# Patient Record
Sex: Male | Born: 1935 | Race: White | Hispanic: No | Smoking: Never smoker
Health system: Southern US, Community
[De-identification: ages and names within clinical notes are randomized; demographics above are authoritative.]

## PROBLEM LIST (undated history)

## (undated) DIAGNOSIS — C100 Malignant neoplasm of vallecula: Secondary | ICD-10-CM

## (undated) DIAGNOSIS — C61 Malignant neoplasm of prostate: Secondary | ICD-10-CM

## (undated) HISTORY — PX: PROSTATE SURGERY: SHX751

## (undated) HISTORY — DX: Malignant neoplasm of vallecula: C10.0

## (undated) HISTORY — DX: Malignant neoplasm of prostate: C61

---

## 1998-01-01 ENCOUNTER — Ambulatory Visit (HOSPITAL_BASED_OUTPATIENT_CLINIC_OR_DEPARTMENT_OTHER): Admission: RE | Admit: 1998-01-01 | Discharge: 1998-01-01 | Payer: Self-pay | Admitting: Otolaryngology

## 1998-01-08 ENCOUNTER — Encounter: Admission: RE | Admit: 1998-01-08 | Discharge: 1998-04-08 | Payer: Self-pay | Admitting: Radiation Oncology

## 2000-12-03 ENCOUNTER — Encounter: Payer: Self-pay | Admitting: Emergency Medicine

## 2000-12-03 ENCOUNTER — Emergency Department (HOSPITAL_COMMUNITY): Admission: EM | Admit: 2000-12-03 | Discharge: 2000-12-03 | Payer: Self-pay

## 2002-07-18 ENCOUNTER — Ambulatory Visit (HOSPITAL_COMMUNITY): Admission: RE | Admit: 2002-07-18 | Discharge: 2002-07-18 | Payer: Self-pay | Admitting: Gastroenterology

## 2002-07-18 ENCOUNTER — Encounter (INDEPENDENT_AMBULATORY_CARE_PROVIDER_SITE_OTHER): Payer: Self-pay | Admitting: *Deleted

## 2006-03-27 ENCOUNTER — Encounter (INDEPENDENT_AMBULATORY_CARE_PROVIDER_SITE_OTHER): Payer: Self-pay | Admitting: *Deleted

## 2006-03-27 ENCOUNTER — Ambulatory Visit (HOSPITAL_BASED_OUTPATIENT_CLINIC_OR_DEPARTMENT_OTHER): Admission: RE | Admit: 2006-03-27 | Discharge: 2006-03-27 | Payer: Self-pay | Admitting: Otolaryngology

## 2006-03-27 DIAGNOSIS — C100 Malignant neoplasm of vallecula: Secondary | ICD-10-CM

## 2006-03-27 HISTORY — DX: Malignant neoplasm of vallecula: C10.0

## 2006-11-27 ENCOUNTER — Ambulatory Visit: Admission: RE | Admit: 2006-11-27 | Discharge: 2006-12-12 | Payer: Self-pay | Admitting: Radiation Oncology

## 2007-01-29 ENCOUNTER — Ambulatory Visit (HOSPITAL_COMMUNITY): Admission: RE | Admit: 2007-01-29 | Discharge: 2007-01-29 | Payer: Self-pay | Admitting: Urology

## 2007-04-04 ENCOUNTER — Inpatient Hospital Stay (HOSPITAL_COMMUNITY): Admission: RE | Admit: 2007-04-04 | Discharge: 2007-04-05 | Payer: Self-pay | Admitting: Orthopedic Surgery

## 2007-04-04 ENCOUNTER — Encounter (INDEPENDENT_AMBULATORY_CARE_PROVIDER_SITE_OTHER): Payer: Self-pay | Admitting: Urology

## 2007-04-04 DIAGNOSIS — C61 Malignant neoplasm of prostate: Secondary | ICD-10-CM

## 2007-04-04 HISTORY — DX: Malignant neoplasm of prostate: C61

## 2008-05-03 ENCOUNTER — Emergency Department (HOSPITAL_COMMUNITY): Admission: EM | Admit: 2008-05-03 | Discharge: 2008-05-03 | Payer: Self-pay | Admitting: Emergency Medicine

## 2008-09-13 ENCOUNTER — Encounter: Admission: RE | Admit: 2008-09-13 | Discharge: 2008-09-13 | Payer: Self-pay | Admitting: Sports Medicine

## 2010-04-12 ENCOUNTER — Encounter: Payer: Self-pay | Admitting: Urology

## 2010-08-03 NOTE — Discharge Summary (Signed)
Dustin Wagner, Dustin Wagner         ACCOUNT NO.:  [id number]   MEDICAL RECORD NO.:  [id number]          PATIENT TYPE:  INP   LOCATION:  1432                         FACILITY:  [facility]   PHYSICIAN:  Heloise Purpura, MD      DATE OF BIRTH:  [birth year]   DATE OF ADMISSION:  04/04/2007  DATE OF DISCHARGE:  04/05/2007                               DISCHARGE SUMMARY   ADMISSION DIAGNOSIS:  Prostate cancer.   DISCHARGE DIAGNOSIS:  Prostate cancer.   HISTORY:  Mr. Yuhas is a 75 year old gentleman with clinically  localized adenocarcinoma of the prostate.  After discussion regarding  management options for treatment, he elected to proceed with surgical  therapy and a robotic prostatectomy.   HOSPITAL COURSE:  On April 04, 2007, the patient was taken to the  operating room and underwent a robotic-assisted laparoscopic radical  prostatectomy and bilateral pelvic lymphadenectomy.  He tolerated this  procedure well and without complications.  Postoperatively, he was able  to be transferred to a regular hospital room following recovery from  anesthesia.  He was able to begin ambulating the night of surgery and  remained hemodynamically stable that evening.  His hematocrit was  checked on the morning of postoperative day #1 and was found to be  stable at 38.1.  He was begun on a clear liquid diet, which he tolerated  without difficulty and was able to be transitioned to oral pain  medication.  He maintained excellent urine output from his catheter with  minimal output from his pelvic drain, and his pelvic drain was removed.  By the afternoon of postoperative day #1, he had met all discharge  criteria and was able to be discharged home in excellent condition.   DISPOSITION:  Home.   DISCHARGE MEDICATIONS:  He was instructed to resume his regular home  medications excepting any aspirin, nonsteroidal anti-inflammatory drugs,  or herbal supplements.  He was given a prescription to take Vicodin  as  needed for pain and Cipro to begin 1 day prior to his return visit for  catheter removal.   DISCHARGE INSTRUCTIONS:  He was instructed to be ambulatory but  specifically told to refrain from any heavy lifting, strenuous activity,  or driving.  He was told to gradually advance his diet once passing  flatus.  In addition, he was instructed on Foley catheter care.   DISPOSITION:  He will follow up in 1 week for removal of his catheter  and to discuss surgical pathology in detail.      Heloise Purpura, MD  Electronically Signed     LB/MEDQ  D:  04/05/2007  T:  04/05/2007  Job:  [id number]

## 2010-08-03 NOTE — Op Note (Signed)
NAMEGRANITE, GODMAN         ACCOUNT NO.:  [id number]   MEDICAL RECORD NO.:  [id number]          PATIENT TYPE:  INP   LOCATION:  0004                         FACILITY:  [facility]   PHYSICIAN:  Heloise Purpura, MD      DATE OF BIRTH:  1935/05/28   DATE OF PROCEDURE:  04/04/2007  DATE OF DISCHARGE:                               OPERATIVE REPORT   PREOPERATIVE DIAGNOSIS:  Clinically localized adenocarcinoma of  prostate.   POSTOPERATIVE DIAGNOSIS:  Clinically localized adenocarcinoma of  prostate.   PROCEDURE:  1. Robotic assisted laparoscopic radical prostatectomy (bilateral      nerve sparing).  2. Bilateral laparoscopic pelvic lymphadenectomy.   SURGEON:  Dr. Heloise Purpura.   ASSISTANT:  Dr. Georgeanna Lea   ANESTHESIA:  General.   COMPLICATIONS:  None.   ESTIMATED BLOOD LOSS:  150 mL.   INTRAVENOUS FLUIDS:  1500 mL of lactated Ringer's.   SPECIMENS:  1. Prostate seminal vesicles.  2. Right pelvic lymph nodes.  3. Left pelvic lymph nodes.   DISPOSITION OF SPECIMENS:  To pathology.   DRAINS:  1. 20-French coude catheter.  2. #19 Blake pelvic drain.   INDICATION:  Mr. Schwegler is a 75 year old gentleman with clinically  localized adenocarcinoma of the prostate.  After discussing management  options for treatment, he elected to proceed with the surgical removal  and the above procedures.  Potential risks, complications, and  alternative options were discussed with the patient in detail and  informed consent was obtained.   DESCRIPTION OF PROCEDURE:  The patient was taken to the operating room  and a general anesthetic was administered.  He was given preoperative  antibiotics, placed in the dorsal lithotomy position, prepped and draped  in the usual sterile fashion.  Next, a preoperative time-out was  performed.  A Foley catheter was then inserted into the bladder and a  site was selected just to the left of the umbilicus for placement of the  camera port.   This was placed using a standard open Hasson technique  which allowed entry into the peritoneal cavity under direct vision  without difficulty.  Next a 12 mm port was placed and a pneumoperitoneum  was established.  The 0 degrees lens was used to inspect the abdomen and  there was no evidence of any intra-abdominal injuries or other  abnormalities.  The remaining ports were then placed.  Bilateral 8 mm  robotic ports were placed 10 cm lateral to the camera port and just  inferior to the camera port site.  An additional 8 mm robotic port was  placed in the far left lateral abdominal wall.  A 5 mm port was placed  between the camera port and the right robotic port.  An additional 12 mm  port was placed in the far right lateral abdominal wall for laparoscopic  assistance.  All ports were placed under direct vision and without  difficulty.  The surgical cart was then docked.  With the aid of the  cautery scissors, the bladder was reflected posteriorly allowing entry  into the space of Retzius and identification of the endopelvic fascia  and prostate.  The endopelvic fascia was incised from the apex back to  the base of the prostate bilaterally and the underlying levator muscle  fibers were swept laterally off the prostate.  This isolated the dorsal  venous complex which was then stapled and divided with a 45 mm Flex ETS  stapler.  The bladder neck was then identified with the aid of Foley  catheter manipulation and divided anteriorly.  This exposed the Foley  catheter which was brought into the operative field after catheter  balloon was deflated.  The catheter was then used to retract the  prostate anteriorly and the posterior bladder neck was then divided  allowing dissection between the prostate and bladder neck until the vasa  deferentia and seminal vesicles were identified.  The vasa deferentia  were isolated, divided and lifted anteriorly.  The seminal vesicles were  then dissected down  toward their tips with care to control the seminal  vesicle blood supply with Hem-o-lok clips.  The seminal vesicles were  then lifted anteriorly and the space between Denonvilliers fascia and  the anterior rectum was bluntly developed.  This isolated the vascular  pedicles of the prostate.  The lateral prostatic fascia was then incised  sharply bilaterally allowing the neurovascular bundles to be swept  laterally and posteriorly off the prostate.  The vascular pedicles of  the prostate were then ligated with Hem-o-lok clips above the level of  the neurovascular bundles and divided with sharp cold scissor  dissection.  The neurovascular bundles were then swept off the apex of  the prostate and urethra in the urethra was sharply divided allowing the  prostate specimen to be disarticulated.  The pelvis was then copiously  irrigated and hemostasis was ensured.  There was no evidence of a rectal  injury.  Attention then turned to the right pelvic sidewall.  The  fibrofatty tissue between the external iliac vein, confluence of the  iliac vessels, hypogastric artery, and Cooper's ligament was dissected  free from the pelvic sidewall with care to preserve the obturator nerve.  Hem-o-lok clips were used for lymphostasis and hemostasis.  This  specimen was then removed for permanent pathologic analysis and an  identical procedure was performed on the contralateral side.  Attention  then turned to the urethral anastomosis.  A 2-0 Vicryl slip-knot was  placed between the fascia, the posterior bladder neck and the posterior  urethra to reapproximate these structures.  Upon cinching this knot  down, the stitch did pull through the posterior bladder neck.  However,  the posterior urethra was secured to Denonvilliers fascia appropriately.  A second 2-0 Vicryl slip-knot was then placed between the bladder neck  and the posterior urethra to reapproximate these structures  successfully.  A double-armed 3-0  Monocryl suture was then used to  perform a 360 degrees running tension-free anastomosis between the  bladder neck and urethra.  A new 20-French coude catheter was inserted  into the bladder and irrigated.  There no blood clots and anastomosis  appeared to be watertight.  A #19 Blake drain was brought through the  left robotic port appropriately positioned in the pelvis.  It was  secured to skin with a nylon suture.  The surgical cart was then  undocked.  The right lateral 12 mm port site was then closed with a 0-0  Vicryl suture placed with the aid of the suture passer device.  The  remaining ports were removed under direct vision and the prostate  specimen was removed intact  within the Endopouch retrieval bag via the  periumbilical incision site.  This fascial opening was closed with a  running 0-0 Vicryl suture.  All ports were  injected with 0.25% Marcaine and reapproximated at the skin level with  staples.  Sterile dressings were applied.  The patient appeared to  tolerate the procedure well and without complications.  He was able to  be extubated and transferred to the recovery unit in satisfactory  condition.      Heloise Purpura, MD  Electronically Signed     LB/MEDQ  D:  04/04/2007  T:  04/04/2007  Job:  [id number]

## 2010-08-03 NOTE — H&P (Signed)
NAMESACHIT, GILMAN NO.:  [id number]   MEDICAL RECORD NO.:  [id number]          PATIENT TYPE:  INP   LOCATION:  0004                         [facility]ACILITY:  [facility]   PHYSICIAN:  Heloise Purpura, MD      DATE OF BIRTH:  Nov 10, 1935   DATE OF ADMISSION:  04/04/2007  DATE OF DISCHARGE:                              HISTORY & PHYSICAL   CHIEF COMPLAINT:  Prostate cancer.   HISTORY:  Mr. Huckins is a 75 year old gentleman with clinical stage  T1C prostate cancer with a PSA of 8.18 and Gleason score of 3 +4 equals  7.  He underwent a bone scan which demonstrated no evidence of  metastatic disease.  After discussing management options for treatment,  he elected to proceed with surgical therapy and a robotic prostatectomy.   PAST MEDICAL HISTORY:  History of laryngeal cancer with no evidence of  disease recurrence.   PAST SURGICAL HISTORY:  Tonsillectomy.   MEDICATIONS:  None.   ALLERGIES:  No known drug allergies.   FAMILY HISTORY:  No history of prostate cancer or GU malignancy.  His  father died of a myocardial infarction.   SOCIAL HISTORY:  He denies alcohol or tobacco use   REVIEW OF SYSTEMS:  A complete Review of Systems was performed.  Pertinent positives include blurred vision, cough and polydipsia.   PHYSICAL EXAMINATION:  CONSTITUTIONAL: well-nourished, well-developed  age-appropriate male in no acute distress.  PULMONARY:  Lungs are clear bilaterally.  CARDIOVASCULAR:  Regular rate and rhythm without obvious murmurs.  ABDOMEN:  Soft, nontender, nondistended without abdominal masses.  GU: No prostate nodularity or induration.   IMPRESSION:  Clinically localized adenocarcinoma of prostate.   PLAN:  He will undergo robotic assisted laparoscopic radical  prostatectomy and bilateral pelvic lymphadenectomy.  He will then be  admitted to the hospital for routine postoperative care.      Heloise Purpura, MD  Electronically Signed     LB/MEDQ  D:   04/04/2007  T:  04/04/2007  Job:  [id number]

## 2010-08-06 NOTE — Op Note (Signed)
   NAME:  Dustin Wagner, CULL NO.:  [id number]   MEDICAL RECORD NO.:  [id number]                   PATIENT TYPE:  AMB   LOCATION:  ENDO                                 FACILITY:  [facility]   PHYSICIAN:  Graylin Shiver, M.D.                DATE OF BIRTH:  1936/01/29   DATE OF PROCEDURE:  07/18/2002  DATE OF DISCHARGE:                                 OPERATIVE REPORT   PROCEDURE:  Colonoscopy with biopsy.   INDICATIONS FOR PROCEDURE:  Screening.   PREMEDICATION:  Demerol 60 mg IV, Versed 6 mg IV.   INFORMED CONSENT:  Informed consent was obtained.   DESCRIPTION OF PROCEDURE:  With the patient in the left lateral decubitus  position, a rectal exam was performed. No masses were felt. There were some  small prolapsed hemorrhoids. The Olympus colonoscope was inserted into the  rectum and advanced around the colon to the cecum. Cecal landmarks were  identified. The cecum and ascending colon were normal. The transverse colon  was normal. In the proximal descending colon near the region of the splenic  flexure, there was a slightly raised 5 mm reddish area of tissue which may  be a small polyp. This was biopsied for histological inspection. The  descending colon and sigmoid revealed moderate diverticulosis. The rectum  appeared normal. The scope was brought out and he tolerated the procedure  well without complications.   IMPRESSION:  1. Possible polyp in the region of the splenic flexure area. The pathology     will be checked to determine if this is adenomatous hyperplastic or an     inflammatory area.  2. Diverticulosis of the left colon.  3. Hemorrhoids.                                               Graylin Shiver, M.D.    SFG/MEDQ  D:  07/18/2002  T:  07/18/2002  Job:  [id number]   cc:   Chales Salmon. Abigail Miyamoto, M.D.  [address]  [place]  [place] [address]  Fax: [phone]

## 2010-08-06 NOTE — Op Note (Signed)
NAMECOADY, TRAIN           ACCOUNT NO.:  [id number]   MEDICAL RECORD NO.:  [id number]          PATIENT TYPE:  AMB   LOCATION:  DSC                          FACILITY:  [facility]   PHYSICIAN:  Jefry H. Pollyann Kennedy, MD     DATE OF BIRTH:  [birth year]   DATE OF PROCEDURE:  03/27/2006  DATE OF DISCHARGE:                               OPERATIVE REPORT   PREOPERATIVE DIAGNOSIS:  Right vallecular mass.   POSTOPERATIVE DIAGNOSIS:  Right vallecular mass.   PROCEDURE:  Direct laryngoscopy with biopsy of right vallecular mass.   SURGEON:  Jefry H. Pollyann Kennedy, MD.   ANESTHESIA:  General endotracheal anesthesia was used.   COMPLICATIONS:  No complications.   BLOOD LOSS:  None.   FINDINGS:  Mild cobblestone appearance to the mucosa of the right  vallecula.  Biopsy was taken and sent for pathologic evaluation.  No  other findings noted.   HISTORY:  A 75 year old with a several-week history of a painful lesion  in the right vallecula that has been slow to resolve.  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  The risks, benefits, alternatives and complications to the procedure  were explained to the patient.  She did understand and agreed to  surgery.   DESCRIPTION OF PROCEDURE:  The patient was taken to the operating room  and placed on the operating room table in a supine position.  Following  induction of general endotracheal anesthesia, the table was turned.  The  patient was draped in the standard fashion.  Maxillary and mandibular  teeth protectors were used.  An anterior commissure laryngoscope was  used to examine the larynx and hypopharynx.  The above-mentioned  findings were noted.  Biopsies were taken.  The patient was then  awakened, extubated and transferred to recovery in stable condition.      Jefry H. Pollyann Kennedy, MD  Electronically Signed     JHR/MEDQ  D:  03/27/2006  T:  03/27/2006  Job:  [id number]

## 2010-12-09 LAB — TYPE AND SCREEN: Antibody Screen: NEGATIVE

## 2010-12-09 LAB — CBC
Hemoglobin: 15.1
MCHC: 34.9
RDW: 13.1

## 2010-12-09 LAB — BASIC METABOLIC PANEL
CO2: 28
Calcium: 9.2
Glucose, Bld: 158 — ABNORMAL HIGH
Sodium: 140

## 2010-12-09 LAB — HEMOGLOBIN AND HEMATOCRIT, BLOOD
HCT: 39.8
HCT: 38.1 — ABNORMAL LOW
Hemoglobin: 13.3

## 2011-01-17 ENCOUNTER — Encounter (INDEPENDENT_AMBULATORY_CARE_PROVIDER_SITE_OTHER): Payer: BC Managed Care – PPO | Admitting: Ophthalmology

## 2011-01-17 DIAGNOSIS — H33309 Unspecified retinal break, unspecified eye: Secondary | ICD-10-CM

## 2011-01-17 DIAGNOSIS — H43819 Vitreous degeneration, unspecified eye: Secondary | ICD-10-CM

## 2011-01-17 DIAGNOSIS — H2510 Age-related nuclear cataract, unspecified eye: Secondary | ICD-10-CM

## 2011-01-17 DIAGNOSIS — H3530 Unspecified macular degeneration: Secondary | ICD-10-CM

## 2011-05-13 DIAGNOSIS — C61 Malignant neoplasm of prostate: Secondary | ICD-10-CM | POA: Diagnosis not present

## 2011-05-20 DIAGNOSIS — R32 Unspecified urinary incontinence: Secondary | ICD-10-CM | POA: Diagnosis not present

## 2011-05-20 DIAGNOSIS — N529 Male erectile dysfunction, unspecified: Secondary | ICD-10-CM | POA: Diagnosis not present

## 2011-05-20 DIAGNOSIS — C61 Malignant neoplasm of prostate: Secondary | ICD-10-CM | POA: Diagnosis not present

## 2011-07-15 DIAGNOSIS — M545 Low back pain: Secondary | ICD-10-CM | POA: Diagnosis not present

## 2011-08-02 DIAGNOSIS — M545 Low back pain: Secondary | ICD-10-CM | POA: Diagnosis not present

## 2011-08-17 DIAGNOSIS — M545 Low back pain: Secondary | ICD-10-CM | POA: Diagnosis not present

## 2011-11-16 DIAGNOSIS — C61 Malignant neoplasm of prostate: Secondary | ICD-10-CM | POA: Diagnosis not present

## 2011-11-23 DIAGNOSIS — C61 Malignant neoplasm of prostate: Secondary | ICD-10-CM | POA: Diagnosis not present

## 2011-11-23 DIAGNOSIS — R32 Unspecified urinary incontinence: Secondary | ICD-10-CM | POA: Diagnosis not present

## 2011-11-23 DIAGNOSIS — N529 Male erectile dysfunction, unspecified: Secondary | ICD-10-CM | POA: Diagnosis not present

## 2012-01-17 ENCOUNTER — Encounter (INDEPENDENT_AMBULATORY_CARE_PROVIDER_SITE_OTHER): Payer: BC Managed Care – PPO | Admitting: Ophthalmology

## 2012-01-20 ENCOUNTER — Encounter (INDEPENDENT_AMBULATORY_CARE_PROVIDER_SITE_OTHER): Payer: BC Managed Care – PPO | Admitting: Ophthalmology

## 2012-06-10 ENCOUNTER — Ambulatory Visit (INDEPENDENT_AMBULATORY_CARE_PROVIDER_SITE_OTHER): Payer: Medicare Other | Admitting: Family Medicine

## 2012-06-10 VITALS — BP 126/81 | HR 70 | Temp 99.1°F | Resp 16 | Ht 70.0 in | Wt 204.8 lb

## 2012-06-10 DIAGNOSIS — M7130 Other bursal cyst, unspecified site: Secondary | ICD-10-CM

## 2012-06-10 DIAGNOSIS — J029 Acute pharyngitis, unspecified: Secondary | ICD-10-CM

## 2012-06-10 DIAGNOSIS — R1310 Dysphagia, unspecified: Secondary | ICD-10-CM | POA: Diagnosis not present

## 2012-06-10 DIAGNOSIS — C61 Malignant neoplasm of prostate: Secondary | ICD-10-CM | POA: Diagnosis not present

## 2012-06-10 DIAGNOSIS — E785 Hyperlipidemia, unspecified: Secondary | ICD-10-CM

## 2012-06-10 DIAGNOSIS — R109 Unspecified abdominal pain: Secondary | ICD-10-CM

## 2012-06-10 LAB — POCT CBC
Granulocyte percent: 78.9 %G (ref 37–80)
HCT, POC: 55.4 % — AB (ref 43.5–53.7)
Hemoglobin: 18 g/dL (ref 14.1–18.1)
Lymph, poc: 2.1 (ref 0.6–3.4)
MCH, POC: 30.7 pg (ref 27–31.2)
MCHC: 32.5 g/dL (ref 31.8–35.4)
MCV: 94.3 fL (ref 80–97)
MID (cbc): 0.9 (ref 0–0.9)
MPV: 9.9 fL (ref 0–99.8)
POC Granulocyte: 11.5 — AB (ref 2–6.9)
POC LYMPH PERCENT: 14.6 %L (ref 10–50)
POC MID %: 6.5 %M (ref 0–12)
Platelet Count, POC: 236 10*3/uL (ref 142–424)
RBC: 5.87 M/uL (ref 4.69–6.13)
RDW, POC: 13.9 %
WBC: 14.6 10*3/uL — AB (ref 4.6–10.2)

## 2012-06-10 LAB — COMPREHENSIVE METABOLIC PANEL
ALT: 22 U/L (ref 0–53)
AST: 19 U/L (ref 0–37)
Albumin: 4.6 g/dL (ref 3.5–5.2)
Alkaline Phosphatase: 79 U/L (ref 39–117)
BUN: 17 mg/dL (ref 6–23)
CO2: 29 mEq/L (ref 19–32)
Calcium: 9.2 mg/dL (ref 8.4–10.5)
Chloride: 100 mEq/L (ref 96–112)
Creat: 1.01 mg/dL (ref 0.50–1.35)
Glucose, Bld: 146 mg/dL — ABNORMAL HIGH (ref 70–99)
Potassium: 4.2 mEq/L (ref 3.5–5.3)
Sodium: 137 mEq/L (ref 135–145)
Total Bilirubin: 1.8 mg/dL — ABNORMAL HIGH (ref 0.3–1.2)
Total Protein: 8.3 g/dL (ref 6.0–8.3)

## 2012-06-10 LAB — LIPID PANEL
Cholesterol: 193 mg/dL (ref 0–200)
HDL: 40 mg/dL (ref >39)
LDL Cholesterol: 128 mg/dL — ABNORMAL HIGH (ref 0–99)
Total CHOL/HDL Ratio: 4.8 Ratio
Triglycerides: 124 mg/dL (ref <150)
VLDL: 25 mg/dL (ref 0–40)

## 2012-06-10 LAB — TSH: TSH: 1.643 u[IU]/mL (ref 0.350–4.500)

## 2012-06-10 MED ORDER — METRONIDAZOLE 500 MG PO TABS: 500.0000 mg | ORAL_TABLET | Freq: Two times a day (BID) | ORAL | Status: DC

## 2012-06-10 MED ORDER — LEVOFLOXACIN 500 MG PO TABS: 500.0000 mg | ORAL_TABLET | Freq: Every day | ORAL | Status: DC

## 2012-06-10 NOTE — Progress Notes (Signed)
Subjective:    Patient ID: Dustin Wagner, male    DOB: June 24, 1935, 77 y.o.   MRN: [id number] Chief Complaint  Patient presents with  . Sore Throat    started this am  . Diarrhea    diarrhea and constipation switching off for two months    HPI Needs to get a thorough check up. Does not have a PCP as no one is accepting new medicare patients and he has not had a complete physical in many years.   No current medical problems and does not take any prescription medications.  He has had prostate and tongue cancer in the past. He does see his urologist approx every yr or less - has prob been about a yr.  Does not remember the last time he had routine labs - cholesterol, etc.    Wonders if he has diverticulosis. Friday he had severe abd pains - generalized and ached everywhere.  Few fever/chills.  In the past he would occasionally have stomach cramps and alternating constipation/diarrhea which is new in the last several months and has been worsening.  Stools were occasional normal in between those episodes.  Has had a colonoscopy - thought it was within the last 5 yrs and that he was polyp free and no diverticulosis or other sig abnormality that he could remember at that time.  Doesn't remember the physician or practice that did it.  Last BM was yesterday and was normal.  On review of Epic, it appears that his last colnoscopy was actually in 2004 with an adenomatous polyp done by Dr. Evette Cristal.  Is a little hard to swallow.  Some nasal congestion and coughing a little.  Slight ear pain and headache.  These sxs started about Thursday.  He has chronic dry mouth from his radiation after his tongue cancer.  Since then, it has become progressively difficult  Does not take a flu shot as it always gives him the flu.  Did get a zoster vaccine but does not remember his last TDaP or Pneumococcal vaccine  Has not eaten today - completely fasting.  Past Medical History  Diagnosis Date  . Cancer    History  reviewed. No pertinent past surgical history. No current outpatient prescriptions on file prior to visit.   No current facility-administered medications on file prior to visit.   No Known Allergies  History reviewed. No pertinent family history. History   Social History  . Marital Status: Widowed as of 2007    Spouse Name: N/A    Number of Children: N/A  . Years of Education: N/A   Social History Main Topics  . Smoking status: Never Smoker   . Smokeless tobacco: None  . Alcohol Use: No  . Drug Use: No  . Sexually Active: No   Other Topics Concern  . None   Social History Narrative  . Prev in [employer]   Review of Systems  Constitutional: Positive for chills, diaphoresis and fatigue. Negative for fever, activity change, appetite change and unexpected weight change.  HENT: Positive for congestion, sore throat, rhinorrhea and postnasal drip. Negative for ear pain, mouth sores, neck pain, neck stiffness and sinus pressure.   Respiratory: Positive for cough. Negative for shortness of breath.   Cardiovascular: Negative for chest pain.  Gastrointestinal: Positive for abdominal pain, diarrhea and constipation. Negative for nausea and vomiting.  Genitourinary: Negative for dysuria.  Musculoskeletal: Negative for myalgias and arthralgias.  Skin: Negative for rash.  Neurological: Positive for headaches. Negative for syncope.  Hematological: Negative for adenopathy.  Psychiatric/Behavioral: Positive for sleep disturbance.      BP 126/81  Pulse 70  Temp(Src) 99.1 F (37.3 C) (Oral)  Resp 16  Ht 5\' 10"  (1.778 m)  Wt 204 lb 12.8 oz (92.897 kg)  BMI 29.39 kg/m2  SpO2 95% Objective:   Physical Exam  Constitutional: He is oriented to person, place, and time. He appears well-developed and well-nourished. No distress.  HENT:  Head: Normocephalic and atraumatic.  Right Ear: External ear and ear canal normal. Tympanic membrane is retracted. A middle ear effusion is present.  Left  Ear: External ear and ear canal normal. Tympanic membrane is retracted. A middle ear effusion is present.  Nose: Mucosal edema and rhinorrhea present. Right sinus exhibits maxillary sinus tenderness. Left sinus exhibits maxillary sinus tenderness.  Mouth/Throat: Uvula is midline and mucous membranes are normal. Posterior oropharyngeal erythema present. No oropharyngeal exudate or posterior oropharyngeal edema.  Eyes: Conjunctivae are normal. Right eye exhibits no discharge. Left eye exhibits no discharge. No scleral icterus.  Neck: Normal range of motion. Neck supple. No thyromegaly present.  Cardiovascular: Normal rate, regular rhythm, normal heart sounds and intact distal pulses.   Pulmonary/Chest: Effort normal and breath sounds normal. No respiratory distress.  Abdominal: Soft. Normal appearance and bowel sounds are normal. He exhibits no distension and no mass. There is tenderness in the left lower quadrant. There is no rebound, no guarding and no CVA tenderness. No hernia.  Genitourinary: Rectum normal and prostate normal. Rectal exam shows no tenderness and anal tone normal. Guaiac negative stool.  Lymphadenopathy:       Head (right side): Submandibular adenopathy present.       Head (left side): Submandibular adenopathy present.    He has no cervical adenopathy.       Right: No supraclavicular adenopathy present.       Left: No supraclavicular adenopathy present.  Neurological: He is alert and oriented to person, place, and time.  Skin: Skin is warm and dry. He is not diaphoretic. No erythema.  Psychiatric: He has a normal mood and affect. His behavior is normal.      Results for orders placed in visit on 06/10/12  POCT CBC      Result Value Range   WBC 14.6 (*) 4.6 - 10.2 K/uL   Lymph, poc 2.1  0.6 - 3.4   POC LYMPH PERCENT 14.6  10 - 50 %L   MID (cbc) 0.9  0 - 0.9   POC MID % 6.5  0 - 12 %M   POC Granulocyte 11.5 (*) 2 - 6.9   Granulocyte percent 78.9  37 - 80 %G   RBC 5.87   4.69 - 6.13 M/uL   Hemoglobin 18.0  14.1 - 18.1 g/dL   HCT, POC 16.1 (*) 09.6 - 53.7 %   MCV 94.3  80 - 97 fL   MCH, POC 30.7  27 - 31.2 pg   MCHC 32.5  31.8 - 35.4 g/dL   RDW, POC 04.5     Platelet Count, POC 236  142 - 424 K/uL   MPV 9.9  0 - 99.8 fL    Assessment & Plan:  Sore throat - Plan: POCT CBC  Abdominal  pain, other specified site - Plan: Comprehensive metabolic panel, Ambulatory referral to Gastroenterology - pt is long overdue to his colonoscopy.  Definitely having LLQ pain on exam today in addition to elev WBC will go ahead and cover with antibiotics for diverticulosis but use  levaquin instead of cipro due to complicating acute URI sxs/pharyngitis.  Hyperlipidemia - Plan: TSH, Lipid panel  Dysphagia, unspecified - Plan: Ambulatory referral to Gastroenterology - possible from dry mouth but could have some esophageal scarring and/or stricturing from prior radiation for tongue cancer so will refer to GI to consider upper endoscopy vs barium swallow.  In future, could also refer back to ENT considering his h/o tongue cancer.  H/o Prostate cancer - Plan: PSA, followed by Dr. Laverle Patter  HM - rec TDaP and pneumovax but pt declines today and does not get flu shot.  He would like to establish primary care here as he has been unable to find any other PCP that are accepting new medicare pt's.  Unfortunately, we are not accepting new medicare pt's at our [facility] clinic but will be happy to provide pt's primary care here at our [facility].  Informed pt that we did most of his HM review today but that if he would like to return for his annual medicare wellness exam, that is fine.  Meds ordered this encounter  Medications  . levofloxacin (LEVAQUIN) 500 MG tablet    Sig: Take 1 tablet (500 mg total) by mouth daily.    Dispense:  7 tablet    Refill:  0  . metroNIDAZOLE (FLAGYL) 500 MG tablet    Sig: Take 1 tablet (500 mg total) by mouth 2 (two) times daily with a meal. DO NOT CONSUME ALCOHOL  WHILE TAKING THIS MEDICATION.    Dispense:  14 tablet    Refill:  0   Norberto Sorenson, MD MPH

## 2012-06-10 NOTE — Patient Instructions (Addendum)

## 2012-06-12 ENCOUNTER — Encounter: Payer: Self-pay | Admitting: Family Medicine

## 2012-06-12 ENCOUNTER — Encounter: Payer: Self-pay | Admitting: *Deleted

## 2012-06-15 DIAGNOSIS — R198 Other specified symptoms and signs involving the digestive system and abdomen: Secondary | ICD-10-CM | POA: Diagnosis not present

## 2012-06-15 DIAGNOSIS — Z86010 Personal history of colonic polyps: Secondary | ICD-10-CM | POA: Diagnosis not present

## 2012-06-15 DIAGNOSIS — R1310 Dysphagia, unspecified: Secondary | ICD-10-CM | POA: Diagnosis not present

## 2012-06-18 ENCOUNTER — Other Ambulatory Visit (HOSPITAL_COMMUNITY): Payer: Self-pay | Admitting: Gastroenterology

## 2012-06-18 DIAGNOSIS — R1310 Dysphagia, unspecified: Secondary | ICD-10-CM

## 2012-06-21 ENCOUNTER — Other Ambulatory Visit (INDEPENDENT_AMBULATORY_CARE_PROVIDER_SITE_OTHER): Payer: Medicare Other | Admitting: *Deleted

## 2012-06-21 ENCOUNTER — Telehealth: Payer: Self-pay

## 2012-06-21 DIAGNOSIS — R7309 Other abnormal glucose: Secondary | ICD-10-CM | POA: Diagnosis not present

## 2012-06-21 LAB — POCT GLYCOSYLATED HEMOGLOBIN (HGB A1C): Hemoglobin A1C: 6.9

## 2012-06-21 NOTE — Telephone Encounter (Signed)
Pt Dustin Wagner after receiving unable to reach letter. Gave him lab results and instr's to return for Hb A1c. Spoke w/Dr Clelia Croft who stated he can come for lab only and then if it is elevated he can make appt if he prefers. Pt agreed to RTC for lab and I put in the order.

## 2012-06-22 ENCOUNTER — Ambulatory Visit (HOSPITAL_COMMUNITY)
Admission: RE | Admit: 2012-06-22 | Discharge: 2012-06-22 | Disposition: A | Discharge status: Home / Self Care | Payer: Medicare Other | Source: Ambulatory Visit | Attending: Gastroenterology | Admitting: Gastroenterology

## 2012-06-22 ENCOUNTER — Other Ambulatory Visit (HOSPITAL_COMMUNITY): Payer: Medicare Other

## 2012-06-22 DIAGNOSIS — R1310 Dysphagia, unspecified: Secondary | ICD-10-CM | POA: Insufficient documentation

## 2012-06-22 DIAGNOSIS — R1319 Other dysphagia: Secondary | ICD-10-CM | POA: Insufficient documentation

## 2012-06-22 NOTE — Procedures (Signed)
Objective Swallowing Evaluation: Modified Barium Swallowing Study  Patient Details  Name: Dustin Wagner MRN: [id number] Date of Birth: 1936-03-14  Today's Date: 06/22/2012 Time: 1025-1045 SLP Time Calculation (min): 20 min  Past Medical History:  Past Medical History  Diagnosis Date  . Prostate cancer 04/04/2007    s/p radical prostatectomy  . 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 HPI:  77 year old male with PMH of thoat and prostate cancer s/p radiation 15 years ago (85% return of taste buds, poor saliva production) with recent c/o difficulty swallowing characterized by globus sensation. He also complains of recent abdominal pain and fluctuation between constipation and diarrhea.      Assessment / Plan / Recommendation Clinical Impression  Dysphagia Diagnosis: Mild pharyngeal phase dysphagia;Mild cervical esophageal phase dysphagia Clinical impression: Patient presents with a mild pharngo-esophageal dysphagia. Pharyngeal swallow characterized by mildly decreased hyo-laryngeal excursion and epiglottic deflection (likely due to muscle rigidity s/p radiation treatment) and a suspected osteophyte at C5-6 reducing CP relaxation (MD not present to confirm) results in mild-moderate (solids > liquids) pharyngeal residuals post swallow and decreased laryngeal closure and eventual trace penetration of thin liquids to the vocal cords. Patient however consistently responds with throat clearing which is 100% successful at clearing the airway. Additionally, independent use of liquid wash assists in decreasing pharyngeal residuals. SLP provided additional education regarding use of compensatory strategies and aspiration precautions which may be helpful in facilitating both pharyngeal clearance and airway protection. Patient verbalized understanding. Agree with barium swallow to follow this test to assess for further esophageal  deficits which may be impacting pharyngeal swallow.     Treatment Recommendation  No treatment recommended at this time    Diet Recommendation Regular;Thin liquid (moistened solids to compensate for dry mouth)   Liquid Administration via: Cup;Straw Medication Administration: Whole meds with liquid Supervision: Patient able to self feed Compensations: Slow rate;Small sips/bites;Multiple dry swallows after each bite/sip;Clear throat intermittently Postural Changes and/or Swallow Maneuvers: Seated upright 90 degrees;Upright 30-60 min after meal    Other  Recommendations Oral Care Recommendations: Oral care BID   Follow Up Recommendations  None               General HPI: 77 year old male with PMH of thoat and prostate cancer s/p radiation 15 years ago (85% return of taste buds, poor saliva production) with recent c/o difficulty swallowing characterized by globus sensation. He also complains of recent abdominal pain and fluctuation between constipation and diarrhea.  Type of Study: Modified Barium Swallowing Study Reason for Referral: Objectively evaluate swallowing function Previous Swallow Assessment: none reported Diet Prior to this Study: Regular;Thin liquids Temperature Spikes Noted: No Respiratory Status: Room air History of Recent Intubation: No Behavior/Cognition: Alert;Cooperative;Pleasant mood Oral Cavity - Dentition: Adequate natural dentition Oral Motor / Sensory Function: Within functional limits Self-Feeding Abilities: Able to feed self Patient Positioning: Upright in chair Baseline Vocal Quality: Clear Volitional Cough: Strong Volitional Swallow: Able to elicit Anatomy: Other (Comment) (? osteophyte at C5-6; MD not present to confirm. )    Reason for Referral Objectively evaluate swallowing function   Oral Phase Oral Preparation/Oral Phase Oral Phase: WFL   Pharyngeal Phase Pharyngeal Phase Pharyngeal Phase: Impaired Pharyngeal - Thin Pharyngeal - Thin Cup:  Reduced anterior laryngeal mobility;Penetration/Aspiration during swallow;Pharyngeal residue - valleculae;Pharyngeal residue - pyriform sinuses Penetration/Aspiration details (thin cup): Material enters airway, CONTACTS cords then ejected out Pharyngeal - Thin Straw: Reduced  anterior laryngeal mobility;Pharyngeal residue - valleculae;Pharyngeal residue - pyriform sinuses;Penetration/Aspiration during swallow Penetration/Aspiration details (thin straw): Material enters airway, CONTACTS cords then ejected out Pharyngeal - Solids Pharyngeal - Puree: Pharyngeal residue - valleculae;Reduced anterior laryngeal mobility Pharyngeal - Mechanical Soft: Reduced anterior laryngeal mobility;Pharyngeal residue - valleculae Pharyngeal - Pill: Reduced anterior laryngeal mobility;Pharyngeal residue - valleculae  Cervical Esophageal Phase    GO    Cervical Esophageal Phase Cervical Esophageal Phase: Impaired Cervical Esophageal Phase - Thin Thin Cup: Prominent cricopharyngeal segment Thin Straw: Prominent cricopharyngeal segment Cervical Esophageal Phase - Solids Puree: Prominent cricopharyngeal segment Mechanical Soft: Prominent cricopharyngeal segment Pill: Prominent cricopharyngeal segment Cervical Esophageal Phase - Comment Cervical Esophageal Comment: suspect due to anatomical variation    Functional Assessment Tool Used: skilled clinical judgement Functional Limitations: Swallowing Swallow Current Status ([id number]): At least 1 percent but less than 20 percent impaired, limited or restricted Swallow Goal Status [phone]): At least 1 percent but less than 20 percent impaired, limited or restricted Swallow Discharge Status [phone]): At least 1 percent but less than 20 percent impaired, limited or restricted   [facility] MA, CCC-SLP [phone]  [name] 06/22/2012, 11:10 AM

## 2012-06-25 ENCOUNTER — Other Ambulatory Visit: Payer: Self-pay | Admitting: Family Medicine

## 2012-06-25 DIAGNOSIS — E119 Type 2 diabetes mellitus without complications: Secondary | ICD-10-CM

## 2012-06-25 MED ORDER — BLOOD GLUCOSE METER KIT: PACK | Status: DC

## 2012-06-25 MED ORDER — ONETOUCH ULTRASOFT LANCETS MISC: Status: DC

## 2012-06-25 MED ORDER — GLUCOSE BLOOD VI STRP: ORAL_STRIP | Status: DC

## 2012-06-25 NOTE — Progress Notes (Signed)
Please call pt in a glucometer, strips, and lancets of his choice. Sig: Use to check cbgs bid - qam fasting and 2 hrs after largest meal of the day.  Disp: 1 mo supply. Refill: 11.  Dx 250.00, reason for testing frequency - labile cbgs, hyperglycemia.

## 2012-07-17 DIAGNOSIS — Z09 Encounter for follow-up examination after completed treatment for conditions other than malignant neoplasm: Secondary | ICD-10-CM | POA: Diagnosis not present

## 2012-07-17 DIAGNOSIS — K5730 Diverticulosis of large intestine without perforation or abscess without bleeding: Secondary | ICD-10-CM | POA: Diagnosis not present

## 2012-07-17 DIAGNOSIS — Z86010 Personal history of colonic polyps: Secondary | ICD-10-CM | POA: Diagnosis not present

## 2012-07-26 ENCOUNTER — Ambulatory Visit (INDEPENDENT_AMBULATORY_CARE_PROVIDER_SITE_OTHER): Payer: Medicare Other | Admitting: Family Medicine

## 2012-07-26 VITALS — BP 136/83 | HR 68 | Temp 97.8°F | Resp 18 | Ht 71.25 in | Wt 205.2 lb

## 2012-07-26 DIAGNOSIS — K5289 Other specified noninfective gastroenteritis and colitis: Secondary | ICD-10-CM

## 2012-07-26 DIAGNOSIS — K219 Gastro-esophageal reflux disease without esophagitis: Secondary | ICD-10-CM | POA: Diagnosis not present

## 2012-07-26 DIAGNOSIS — M545 Low back pain: Secondary | ICD-10-CM | POA: Diagnosis not present

## 2012-07-26 DIAGNOSIS — E119 Type 2 diabetes mellitus without complications: Secondary | ICD-10-CM | POA: Insufficient documentation

## 2012-07-26 DIAGNOSIS — K529 Noninfective gastroenteritis and colitis, unspecified: Secondary | ICD-10-CM

## 2012-07-26 LAB — CBC WITH DIFFERENTIAL/PLATELET
Basophils Relative: 1 % (ref 0–1)
HCT: 46.6 % (ref 39.0–52.0)
Hemoglobin: 16 g/dL (ref 13.0–17.0)
Lymphs Abs: 1.5 10*3/uL (ref 0.7–4.0)
MCH: 30.4 pg (ref 26.0–34.0)
MCHC: 34.3 g/dL (ref 30.0–36.0)
Monocytes Absolute: 0.5 10*3/uL (ref 0.1–1.0)
Monocytes Relative: 7 % (ref 3–12)
Neutro Abs: 5 10*3/uL (ref 1.7–7.7)
RBC: 5.26 MIL/uL (ref 4.22–5.81)

## 2012-07-26 LAB — COMPREHENSIVE METABOLIC PANEL
Albumin: 4.2 g/dL (ref 3.5–5.2)
Alkaline Phosphatase: 69 U/L (ref 39–117)
BUN: 13 mg/dL (ref 6–23)
CO2: 28 mEq/L (ref 19–32)
Calcium: 9.5 mg/dL (ref 8.4–10.5)
Glucose, Bld: 127 mg/dL — ABNORMAL HIGH (ref 70–99)
Potassium: 4.6 mEq/L (ref 3.5–5.3)
Sodium: 137 mEq/L (ref 135–145)
Total Protein: 7.3 g/dL (ref 6.0–8.3)

## 2012-07-26 MED ORDER — ONDANSETRON 8 MG PO TBDP: 8.0000 mg | ORAL_TABLET | Freq: Three times a day (TID) | ORAL | Status: DC | PRN

## 2012-07-26 MED ORDER — MELOXICAM 7.5 MG PO TABS: 7.5000 mg | ORAL_TABLET | Freq: Two times a day (BID) | ORAL | Status: DC

## 2012-07-26 MED ORDER — LOPERAMIDE HCL 2 MG PO TABS: 2.0000 mg | ORAL_TABLET | Freq: Four times a day (QID) | ORAL | Status: DC | PRN

## 2012-07-26 MED ORDER — RANITIDINE HCL 150 MG PO TABS: 150.0000 mg | ORAL_TABLET | Freq: Two times a day (BID) | ORAL | Status: DC | PRN

## 2012-07-26 MED ORDER — MELOXICAM 7.5 MG PO TABS: 7.5000 mg | ORAL_TABLET | Freq: Two times a day (BID) | ORAL | Status: DC | PRN

## 2012-07-26 NOTE — Progress Notes (Signed)
  Subjective:    Patient ID: Dustin Wagner, male    DOB: March 02, 1936, 77 y.o.   MRN: [id number]  HPI Started on Friday when was in [place] with nausea and vomiting.  The nausea has persisted.  He did a colonoscopy about 7-10d ago which was negative but he is still having a lot of gerd - was mild before but now persistent. Taking rolaids.  No sick contacts.  Was having diarrhea.  For a while was not able to keep any fluids down but now able to tolerate liquids - was doing boost.  Even the thought of food was making him nausea.  No further vomiting since Sat.  Did have fever/chills, no specific abd pain.  Has not eaten today thought he did have a sip of water.   He did feel better after the antibiotics and was noted to have diverticulosis.      Review of Systems     Objective:   Physical Exam        Assessment & Plan:  Get copy of labs from Dr. Evette Cristal - checked sugars before colonoscopy and per pt was normal was 5.something - get copy.

## 2012-07-26 NOTE — Patient Instructions (Signed)
Viral Gastroenteritis Viral gastroenteritis is also known as stomach flu. This condition affects the stomach and intestinal tract. It can cause sudden diarrhea and vomiting. The illness typically lasts 3 to 8 days. Most people develop an immune response that eventually gets rid of the virus. While this natural response develops, the virus can make you quite ill. CAUSES  Many different viruses can cause gastroenteritis, such as rotavirus or noroviruses. You can catch one of these viruses by consuming contaminated food or water. You may also catch a virus by sharing utensils or other personal items with an infected person or by touching a contaminated surface. SYMPTOMS  The most common symptoms are diarrhea and vomiting. These problems can cause a severe loss of body fluids (dehydration) and a body salt (electrolyte) imbalance. Other symptoms may include:  Fever.  Headache.  Fatigue.  Abdominal pain. DIAGNOSIS  Your caregiver can usually diagnose viral gastroenteritis based on your symptoms and a physical exam. A stool sample may also be taken to test for the presence of viruses or other infections. TREATMENT  This illness typically goes away on its own. Treatments are aimed at rehydration. The most serious cases of viral gastroenteritis involve vomiting so severely that you are not able to keep fluids down. In these cases, fluids must be given through an intravenous line (IV). HOME CARE INSTRUCTIONS   Drink enough fluids to keep your urine clear or pale yellow. Drink small amounts of fluids frequently and increase the amounts as tolerated.  Ask your caregiver for specific rehydration instructions.  Avoid:  Foods high in sugar.  Alcohol.  Carbonated drinks.  Tobacco.  Juice.  Caffeine drinks.  Extremely hot or cold fluids.  Fatty, greasy foods.  Too much intake of anything at one time.  Dairy products until 24 to 48 hours after diarrhea stops.  You may consume probiotics.  Probiotics are active cultures of beneficial bacteria. They may lessen the amount and number of diarrheal stools in adults. Probiotics can be found in yogurt with active cultures and in supplements.  Wash your hands well to avoid spreading the virus.  Only take over-the-counter or prescription medicines for pain, discomfort, or fever as directed by your caregiver. Do not give aspirin to children. Antidiarrheal medicines are not recommended.  Ask your caregiver if you should continue to take your regular prescribed and over-the-counter medicines.  Keep all follow-up appointments as directed by your caregiver. SEEK IMMEDIATE MEDICAL CARE IF:   You are unable to keep fluids down.  You do not urinate at least once every 6 to 8 hours.  You develop shortness of breath.  You notice blood in your stool or vomit. This may look like coffee grounds.  You have abdominal pain that increases or is concentrated in one small area (localized).  You have persistent vomiting or diarrhea.  You have a fever.  The patient is a child younger than 3 months, and he or she has a fever.  The patient is a child older than 3 months, and he or she has a fever and persistent symptoms.  The patient is a child older than 3 months, and he or she has a fever and symptoms suddenly get worse.  The patient is a baby, and he or she has no tears when crying. MAKE SURE YOU:   Understand these instructions.  Will watch your condition.  Will get help right away if you are not doing well or get worse. Document Released: 03/07/2005 Document Revised: 05/30/2011 Document Reviewed: 12/22/2010   [employer] Patient Information 2013 [place], [place]. Diabetes, Type 2 Diabetes is a long-lasting (chronic) disease. In type 2 diabetes, the pancreas does not make enough insulin (a hormone), and the body does not respond normally to the insulin that is made. This type of diabetes was also previously called adult-onset diabetes. It  usually occurs after the age of 2, but it can occur at any age.  CAUSES  Type 2 diabetes happens because the pancreasis not making enough insulin or your body has trouble using the insulin that your pancreas does make properly. SYMPTOMS   Drinking more than usual.  Urinating more than usual.  Blurred vision.  Dry, itchy skin.  Frequent infections.  Feeling more tired than usual (fatigue). DIAGNOSIS The diagnosis of type 2 diabetes is usually made by one of the following tests:  Fasting blood glucose test. You will not eat for at least 8 hours and then take a blood test.  Random blood glucose test. Your blood glucose (sugar) is checked at any time of the day regardless of when you ate.  Oral glucose tolerance test (OGTT). Your blood glucose is measured after you have not eaten (fasted) and then after you drink a glucose containing beverage. TREATMENT   Healthy eating.  Exercise.  Medicine, if needed.  Monitoring blood glucose.  Seeing your caregiver regularly. HOME CARE INSTRUCTIONS   Check your blood glucose at least once a day. More frequent monitoring may be necessary, depending on your medicines and on how well your diabetes is controlled. Your caregiver will advise you.  Take your medicine as directed by your caregiver.  Do not smoke.  Make wise food choices. Ask your caregiver for information. Weight loss can improve your diabetes.  Learn about low blood glucose (hypoglycemia) and how to treat it.  Get your eyes checked regularly.  Have a yearly physical exam. Have your blood pressure checked and your blood and urine tested.  Wear a pendant or bracelet saying that you have diabetes.  Check your feet every night for cuts, sores, blisters, and redness. Let your caregiver know if you have any problems. SEEK MEDICAL CARE IF:   You have problems keeping your blood glucose in target range.  You have problems with your medicines.  You have symptoms of an  illness that do not improve after 24 hours.  You have a sore or wound that is not healing.  You notice a change in vision or a new problem with your vision.  You have a fever. MAKE SURE YOU:  Understand these instructions.  Will watch your condition.  Will get help right away if you are not doing well or get worse. Document Released: 03/07/2005 Document Revised: 05/30/2011 Document Reviewed: 08/23/2010 [facility] Patient Information 2013 [place], [place].

## 2012-07-31 DIAGNOSIS — M545 Low back pain: Secondary | ICD-10-CM | POA: Diagnosis not present

## 2012-08-03 DIAGNOSIS — M545 Low back pain: Secondary | ICD-10-CM | POA: Diagnosis not present

## 2012-08-09 DIAGNOSIS — M545 Low back pain: Secondary | ICD-10-CM | POA: Diagnosis not present

## 2012-08-17 DIAGNOSIS — M5137 Other intervertebral disc degeneration, lumbosacral region: Secondary | ICD-10-CM | POA: Diagnosis not present

## 2012-08-17 DIAGNOSIS — M47817 Spondylosis without myelopathy or radiculopathy, lumbosacral region: Secondary | ICD-10-CM | POA: Diagnosis not present

## 2012-08-28 DIAGNOSIS — M1990 Unspecified osteoarthritis, unspecified site: Secondary | ICD-10-CM | POA: Diagnosis not present

## 2012-09-05 DIAGNOSIS — Z85819 Personal history of malignant neoplasm of unspecified site of lip, oral cavity, and pharynx: Secondary | ICD-10-CM | POA: Diagnosis not present

## 2012-09-05 DIAGNOSIS — R1310 Dysphagia, unspecified: Secondary | ICD-10-CM | POA: Diagnosis not present

## 2012-09-05 DIAGNOSIS — K219 Gastro-esophageal reflux disease without esophagitis: Secondary | ICD-10-CM | POA: Diagnosis not present

## 2012-09-10 DIAGNOSIS — C61 Malignant neoplasm of prostate: Secondary | ICD-10-CM | POA: Diagnosis not present

## 2012-09-14 DIAGNOSIS — R32 Unspecified urinary incontinence: Secondary | ICD-10-CM | POA: Diagnosis not present

## 2012-09-14 DIAGNOSIS — C61 Malignant neoplasm of prostate: Secondary | ICD-10-CM | POA: Diagnosis not present

## 2012-09-14 DIAGNOSIS — N529 Male erectile dysfunction, unspecified: Secondary | ICD-10-CM | POA: Diagnosis not present

## 2013-05-10 ENCOUNTER — Encounter (HOSPITAL_COMMUNITY): Payer: Self-pay | Admitting: Emergency Medicine

## 2013-05-10 ENCOUNTER — Emergency Department (HOSPITAL_COMMUNITY)
Admission: EM | Admit: 2013-05-10 | Discharge: 2013-05-10 | Disposition: A | Discharge status: Home / Self Care | Payer: Medicare Other | Attending: Emergency Medicine | Admitting: Emergency Medicine

## 2013-05-10 DIAGNOSIS — R5383 Other fatigue: Secondary | ICD-10-CM | POA: Diagnosis not present

## 2013-05-10 DIAGNOSIS — Z8589 Personal history of malignant neoplasm of other organs and systems: Secondary | ICD-10-CM | POA: Diagnosis not present

## 2013-05-10 DIAGNOSIS — W010XXA Fall on same level from slipping, tripping and stumbling without subsequent striking against object, initial encounter: Secondary | ICD-10-CM | POA: Insufficient documentation

## 2013-05-10 DIAGNOSIS — I498 Other specified cardiac arrhythmias: Secondary | ICD-10-CM | POA: Insufficient documentation

## 2013-05-10 DIAGNOSIS — Y9301 Activity, walking, marching and hiking: Secondary | ICD-10-CM | POA: Insufficient documentation

## 2013-05-10 DIAGNOSIS — R5381 Other malaise: Secondary | ICD-10-CM | POA: Diagnosis not present

## 2013-05-10 DIAGNOSIS — Y9289 Other specified places as the place of occurrence of the external cause: Secondary | ICD-10-CM | POA: Insufficient documentation

## 2013-05-10 DIAGNOSIS — R42 Dizziness and giddiness: Secondary | ICD-10-CM | POA: Diagnosis not present

## 2013-05-10 DIAGNOSIS — Z794 Long term (current) use of insulin: Secondary | ICD-10-CM | POA: Insufficient documentation

## 2013-05-10 DIAGNOSIS — Z923 Personal history of irradiation: Secondary | ICD-10-CM | POA: Diagnosis not present

## 2013-05-10 DIAGNOSIS — Z043 Encounter for examination and observation following other accident: Secondary | ICD-10-CM | POA: Diagnosis not present

## 2013-05-10 DIAGNOSIS — R001 Bradycardia, unspecified: Secondary | ICD-10-CM

## 2013-05-10 DIAGNOSIS — R404 Transient alteration of awareness: Secondary | ICD-10-CM | POA: Diagnosis not present

## 2013-05-10 DIAGNOSIS — Z8546 Personal history of malignant neoplasm of prostate: Secondary | ICD-10-CM | POA: Diagnosis not present

## 2013-05-10 LAB — URINALYSIS, ROUTINE W REFLEX MICROSCOPIC
BILIRUBIN URINE: NEGATIVE
Glucose, UA: 250 mg/dL — AB
HGB URINE DIPSTICK: NEGATIVE
Ketones, ur: 15 mg/dL — AB
Leukocytes, UA: NEGATIVE
NITRITE: NEGATIVE
Protein, ur: 30 mg/dL — AB
Specific Gravity, Urine: 1.02 (ref 1.005–1.030)
UROBILINOGEN UA: 1 mg/dL (ref 0.0–1.0)
pH: 6.5 (ref 5.0–8.0)

## 2013-05-10 LAB — COMPREHENSIVE METABOLIC PANEL
ALBUMIN: 3.7 g/dL (ref 3.5–5.2)
ALT: 18 U/L (ref 0–53)
AST: 21 U/L (ref 0–37)
Alkaline Phosphatase: 75 U/L (ref 39–117)
BUN: 12 mg/dL (ref 6–23)
CALCIUM: 8.9 mg/dL (ref 8.4–10.5)
CO2: 26 mEq/L (ref 19–32)
CREATININE: 0.91 mg/dL (ref 0.50–1.35)
Chloride: 100 mEq/L (ref 96–112)
GFR calc Af Amer: >90 mL/min (ref >90)
GFR calc non Af Amer: 81 mL/min — ABNORMAL LOW (ref >90)
Glucose, Bld: 204 mg/dL — ABNORMAL HIGH (ref 70–99)
Potassium: 4.3 mEq/L (ref 3.7–5.3)
Sodium: 138 mEq/L (ref 137–147)
Total Bilirubin: 0.6 mg/dL (ref 0.3–1.2)
Total Protein: 7.5 g/dL (ref 6.0–8.3)

## 2013-05-10 LAB — URINE MICROSCOPIC-ADD ON

## 2013-05-10 LAB — CBC
HCT: 44.4 % (ref 39.0–52.0)
Hemoglobin: 15.4 g/dL (ref 13.0–17.0)
MCH: 30.7 pg (ref 26.0–34.0)
MCHC: 34.7 g/dL (ref 30.0–36.0)
MCV: 88.6 fL (ref 78.0–100.0)
PLATELETS: 158 10*3/uL (ref 150–400)
RBC: 5.01 MIL/uL (ref 4.22–5.81)
RDW: 13.3 % (ref 11.5–15.5)
WBC: 5.5 10*3/uL (ref 4.0–10.5)

## 2013-05-10 LAB — TROPONIN I: Troponin I: <0.3 ng/mL (ref <0.30)

## 2013-05-10 MED ORDER — SODIUM CHLORIDE 0.9 % IV SOLN
INTRAVENOUS | Status: DC
  Administered 2013-05-10: 12:00:00 via INTRAVENOUS

## 2013-05-10 NOTE — Discharge Instructions (Signed)
From todays lab tests, your glucose level is mildly high (204) - follow diabetic diet, and follow up with primary care doctor in the next couple weeks. As we discussed, the paramedics indicated your heart rate was low earlier at home (in the 40's) - follow up with cardiologist in the next few days - see referral - call office to arrange appointment - discuss the possibility of home/Holter monitoring. Return to ER right away if worse, symptoms recur, weak/faint, rapid or irregular heartbeat, chest pain, trouble breathing, other concern.       Bradycardia Bradycardia is a term for a heart rate (pulse) that, in adults, is slower than 60 beats per minute. A normal rate is 60 to 100 beats per minute. A heart rate below 60 beats per minute may be normal for some adults with healthy hearts. If the rate is too slow, the heart may have trouble pumping the volume of blood the body needs. If the heart rate gets too low, blood flow to the brain may be decreased and may make you feel lightheaded, dizzy, or faint. The heart has a natural pacemaker in the top of the heart called the SA node (sinoatrial or sinus node). This pacemaker sends out regular electrical signals to the muscle of the heart, telling the heart muscle when to beat (contract). The electrical signal travels from the upper parts of the heart (atria) through the AV node (atrioventricular node), to the lower chambers of the heart (ventricles). The ventricles squeeze, pumping the blood from your heart to your lungs and to the rest of your body. CAUSES   Problem with the heart's electrical system.  Problem with the heart's natural pacemaker.  Heart disease, damage, or infection.  Medications.  Problems with minerals and salts (electrolytes). SYMPTOMS   Fainting (syncope).  Fatigue and weakness.  Shortness of breath (dyspnea).  Chest pain (angina).  Drowsiness.  Confusion. DIAGNOSIS   An electrocardiogram (ECG) can help your  caregiver determine the type of slow heart rate you have.  If the cause is not seen on an ECG, you may need to wear a heart monitor that records your heart rhythm for several hours or days.  Blood tests. TREATMENT   Electrolyte supplements.  Medications.  Withholding medication which is causing a slow heart rate.  Pacemaker placement. SEEK IMMEDIATE MEDICAL CARE IF:   You feel lightheaded or faint.  You develop an irregular heart rate.  You feel chest pain or have trouble breathing. MAKE SURE YOU:   Understand these instructions.  Will watch your condition.  Will get help right away if you are not doing well or get worse. Document Released: 11/27/2001 Document Revised: 05/30/2011 Document Reviewed: 10/24/2007 [facility] Patient Information 2014 [place].     Near-Syncope Near-syncope (commonly known as near fainting) is sudden weakness, dizziness, or feeling like you might pass out. During an episode of near-syncope, you may also develop pale skin, have tunnel vision, or feel sick to your stomach (nauseous). Near-syncope may occur when getting up after sitting or while standing for a long time. It is caused by a sudden decrease in blood flow to the brain. This decrease can result from various causes or triggers, most of which are not serious. However, because near-syncope can sometimes be a sign of something serious, a medical evaluation is required. The specific cause is often not determined. HOME CARE INSTRUCTIONS  Monitor your condition for any changes. The following actions may help to alleviate any discomfort you are experiencing:  Have  someone stay with you until you feel stable.  Lie down right away if you start feeling like you might faint. Breathe deeply and steadily. Wait until all the symptoms have passed. Most of these episodes last only a few minutes. You may feel tired for several hours.   Drink enough fluids to keep your urine clear or pale yellow.    If you are taking blood pressure or heart medicine, get up slowly when seated or lying down. Take several minutes to sit and then stand. This can reduce dizziness.  Follow up with your health care provider as directed. SEEK IMMEDIATE MEDICAL CARE IF:   You have a severe headache.   You have unusual pain in the chest, abdomen, or back.   You are bleeding from the mouth or rectum, or you have black or tarry stool.   You have an irregular or very fast heartbeat.   You have repeated fainting or have seizure-like jerking during an episode.   You faint when sitting or lying down.   You have confusion.   You have difficulty walking.   You have severe weakness.   You have vision problems.  MAKE SURE YOU:   Understand these instructions.  Will watch your condition.  Will get help right away if you are not doing well or get worse. Document Released: 03/07/2005 Document Revised: 11/07/2012 Document Reviewed: 08/10/2012 [facility] Patient Information 2014 [place].  Hyperglycemia Hyperglycemia occurs when the glucose (sugar) in your blood is too high. Hyperglycemia can happen for many reasons, but it most often happens to people who do not know they have diabetes or are not managing their diabetes properly.  CAUSES  Whether you have diabetes or not, there are other causes of hyperglycemia. Hyperglycemia can occur when you have diabetes, but it can also occur in other situations that you might not be as aware of, such as: Diabetes  If you have diabetes and are having problems controlling your blood glucose, hyperglycemia could occur because of some of the following reasons:  Not following your meal plan.  Not taking your diabetes medications or not taking it properly.  Exercising less or doing less activity than you normally do.  Being sick. Pre-diabetes  This cannot be ignored. Before people develop Type 2 diabetes, they almost always have "pre-diabetes."  This is when your blood glucose levels are higher than normal, but not yet high enough to be diagnosed as diabetes. Research has shown that some long-term damage to the body, especially the heart and circulatory system, may already be occurring during pre-diabetes. If you take action to manage your blood glucose when you have pre-diabetes, you may delay or prevent Type 2 diabetes from developing. Stress  If you have diabetes, you may be "diet" controlled or on oral medications or insulin to control your diabetes. However, you may find that your blood glucose is higher than usual in the hospital whether you have diabetes or not. This is often referred to as "stress hyperglycemia." Stress can elevate your blood glucose. This happens because of hormones put out by the body during times of stress. If stress has been the cause of your high blood glucose, it can be followed regularly by your caregiver. That way he/she can make sure your hyperglycemia does not continue to get worse or progress to diabetes. Steroids  Steroids are medications that act on the infection fighting system (immune system) to block inflammation or infection. One side effect can be a rise in blood glucose. Most  people can produce enough extra insulin to allow for this rise, but for those who cannot, steroids make blood glucose levels go even higher. It is not unusual for steroid treatments to "uncover" diabetes that is developing. It is not always possible to determine if the hyperglycemia will go away after the steroids are stopped. A special blood test called an A1c is sometimes done to determine if your blood glucose was elevated before the steroids were started. SYMPTOMS  Thirsty.  Frequent urination.  Dry mouth.  Blurred vision.  Tired or fatigue.  Weakness.  Sleepy.  Tingling in feet or leg. DIAGNOSIS  Diagnosis is made by monitoring blood glucose in one or all of the following ways:  A1c test. This is a chemical  found in your blood.  Fingerstick blood glucose monitoring.  Laboratory results. TREATMENT  First, knowing the cause of the hyperglycemia is important before the hyperglycemia can be treated. Treatment may include, but is not be limited to:  Education.  Change or adjustment in medications.  Change or adjustment in meal plan.  Treatment for an illness, infection, etc.  More frequent blood glucose monitoring.  Change in exercise plan.  Decreasing or stopping steroids.  Lifestyle changes. HOME CARE INSTRUCTIONS   Test your blood glucose as directed.  Exercise regularly. Your caregiver will give you instructions about exercise. Pre-diabetes or diabetes which comes on with stress is helped by exercising.  Eat wholesome, balanced meals. Eat often and at regular, fixed times. Your caregiver or nutritionist will give you a meal plan to guide your sugar intake.  Being at an ideal weight is important. If needed, losing as little as 10 to 15 pounds may help improve blood glucose levels. SEEK MEDICAL CARE IF:   You have questions about medicine, activity, or diet.  You continue to have symptoms (problems such as increased thirst, urination, or weight gain). SEEK IMMEDIATE MEDICAL CARE IF:   You are vomiting or have diarrhea.  Your breath smells fruity.  You are breathing faster or slower.  You are very sleepy or incoherent.  You have numbness, tingling, or pain in your feet or hands.  You have chest pain.  Your symptoms get worse even though you have been following your caregiver's orders.  If you have any other questions or concerns. Document Released: 08/31/2000 Document Revised: 05/30/2011 Document Reviewed: 07/04/2011 [facility] Patient Information 2014 [place], [place].     Diabetes Meal Planning Guide The diabetes meal planning guide is a tool to help you plan your meals and snacks. It is important for people with diabetes to manage their blood glucose (sugar)  levels. Choosing the right foods and the right amounts throughout your day will help control your blood glucose. Eating right can even help you improve your blood pressure and reach or maintain a healthy weight. CARBOHYDRATE COUNTING MADE EASY When you eat carbohydrates, they turn to sugar. This raises your blood glucose level. Counting carbohydrates can help you control this level so you feel better. When you plan your meals by counting carbohydrates, you can have more flexibility in what you eat and balance your medicine with your food intake. Carbohydrate counting simply means adding up the total amount of carbohydrate grams in your meals and snacks. Try to eat about the same amount at each meal. Foods with carbohydrates are listed below. Each portion below is 1 carbohydrate serving or 15 grams of carbohydrates. Ask your dietician how many grams of carbohydrates you should eat at each meal or snack. Grains and  Starches  1 slice bread.   English muffin or hotdog/hamburger bun.   cup cold cereal (unsweetened).   cup cooked pasta or rice.   cup starchy vegetables (corn, potatoes, peas, beans, winter squash).  1 tortilla (6 inches).   bagel.  1 waffle or pancake (size of a CD).   cup cooked cereal.  4 to 6 small crackers. *Whole grain is recommended. Fruit  1 cup fresh unsweetened berries, melon, papaya, pineapple.  1 small fresh fruit.   banana or mango.   cup fruit juice (4 oz unsweetened).   cup canned fruit in natural juice or water.  2 tbs dried fruit.  12 to 15 grapes or cherries. Milk and Yogurt  1 cup fat-free or 1% milk.  1 cup soy milk.  6 oz light yogurt with sugar-free sweetener.  6 oz low-fat soy yogurt.  6 oz plain yogurt. Vegetables  1 cup raw or  cup cooked is counted as 0 carbohydrates or a "free" food.  If you eat 3 or more servings at 1 meal, count them as 1 carbohydrate serving. Other Carbohydrates   oz chips or pretzels.    cup ice cream or frozen yogurt.   cup sherbet or sorbet.  2 inch square cake, no frosting.  1 tbs honey, sugar, jam, jelly, or syrup.  2 small cookies.  3 squares of graham crackers.  3 cups popcorn.  6 crackers.  1 cup broth-based soup.  Count 1 cup casserole or other mixed foods as 2 carbohydrate servings.  Foods with less than 20 calories in a serving may be counted as 0 carbohydrates or a "free" food. You may want to purchase a book or computer software that lists the carbohydrate gram counts of different foods. In addition, the nutrition facts panel on the labels of the foods you eat are a good source of this information. The label will tell you how big the serving size is and the total number of carbohydrate grams you will be eating per serving. Divide this number by 15 to obtain the number of carbohydrate servings in a portion. Remember, 1 carbohydrate serving equals 15 grams of carbohydrate. SERVING SIZES Measuring foods and serving sizes helps you make sure you are getting the right amount of food. The list below tells how big or small some common serving sizes are.  1 oz.........4 stacked dice.  3 oz........Marland KitchenDeck of cards.  1 tsp.......Marland KitchenTip of little finger.  1 tbs......Marland KitchenMarland KitchenThumb.  2 tbs.......Marland KitchenGolf ball.   cup......Marland KitchenHalf of a fist.  1 cup.......Marland KitchenA fist. SAMPLE DIABETES MEAL PLAN Below is a sample meal plan that includes foods from the grain and starches, dairy, vegetable, fruit, and meat groups. A dietician can individualize a meal plan to fit your calorie needs and tell you the number of servings needed from each food group. However, controlling the total amount of carbohydrates in your meal or snack is more important than making sure you include all of the food groups at every meal. You may interchange carbohydrate containing foods (dairy, starches, and fruits). The meal plan below is an example of a 2000 calorie diet using carbohydrate counting. This meal plan  has 17 carbohydrate servings. Breakfast  1 cup oatmeal (2 carb servings).   cup light yogurt (1 carb serving).  1 cup blueberries (1 carb serving).   cup almonds. Snack  1 large apple (2 carb servings).  1 low-fat string cheese stick. Lunch  Chicken breast salad.  1 cup spinach.   cup chopped tomatoes.  2  oz chicken breast, sliced.  2 tbs low-fat [place] dressing.  12 whole-wheat crackers (2 carb servings).  12 to 15 grapes (1 carb serving).  1 cup low-fat milk (1 carb serving). Snack  1 cup carrots.   cup hummus (1 carb serving). Dinner  3 oz broiled salmon.  1 cup brown rice (3 carb servings). Snack  1  cups steamed broccoli (1 carb serving) drizzled with 1 tsp olive oil and lemon juice.  1 cup light pudding (2 carb servings). DIABETES MEAL PLANNING WORKSHEET Your dietician can use this worksheet to help you decide how many servings of foods and what types of foods are right for you.  BREAKFAST Food Group and Servings / Carb Servings Grain/Starches __________________________________ Dairy __________________________________________ Vegetable ______________________________________ Fruit ___________________________________________ Meat __________________________________________ Fat ____________________________________________ LUNCH Food Group and Servings / Carb Servings Grain/Starches ___________________________________ Dairy ___________________________________________ Fruit ____________________________________________ Meat ___________________________________________ Fat _____________________________________________ [employer] Food Group and Servings / Carb Servings Grain/Starches ___________________________________ Dairy ___________________________________________ Fruit ____________________________________________ Meat ___________________________________________ Fat _____________________________________________ SNACKS Food Group and Servings /  Carb Servings Grain/Starches ___________________________________ Dairy ___________________________________________ Vegetable _______________________________________ Fruit ____________________________________________ Meat ___________________________________________ Fat _____________________________________________ DAILY TOTALS Starches _________________________ Vegetable ________________________ Fruit ____________________________ Dairy ____________________________ Meat ____________________________ Fat ______________________________ Document Released: 12/02/2004 Document Revised: 05/30/2011 Document Reviewed: 10/13/2008 [employer] Patient Information 2014 [employer].

## 2013-05-10 NOTE — ED Provider Notes (Signed)
CSN: [id number]     Arrival date & time 05/10/13  1114 History   First MD Initiated Contact with Patient 05/10/13 1116     Chief Complaint  Patient presents with  . Fall     (Consider location/radiation/quality/duration/timing/severity/associated sxs/prior Treatment) Patient is a 78 y.o. male presenting with fall. The history is provided by the patient and the spouse.  Fall Pertinent negatives include no chest pain, no abdominal pain, no headaches and no shortness of breath.  pt states just pta today, was walking outside on ice covered walkway, when slipped, and fell. States when tried to get up felt generally weak. Was able to crawl over to bench and get self up, went inside and sat down.  States then noted was feeling light headed, as if about to faint. Denies any faintness or dizziness prior to fall, states felt normal this morning including immediately prior to fall. No hx syncope or dysrhythmia. No current or recent cp or discomfort. Denies injury/pain from fall. When ems arrived, they noted pts hr 40, and bp in 90's. Pt deies any current medication use or new meds.       Past Medical History  Diagnosis Date  . Prostate cancer 04/04/2007    s/p radical prostatectomy  . 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 No pertinent past surgical history. History reviewed. No pertinent family history. History  Substance Use Topics  . Smoking status: Never Smoker   . Smokeless tobacco: Not on file  . Alcohol Use: No    Review of Systems  Constitutional: Negative for fever and chills.  HENT: Negative for sore throat.   Eyes: Negative for visual disturbance.  Respiratory: Negative for cough and shortness of breath.   Cardiovascular: Negative for chest pain, palpitations and leg swelling.  Gastrointestinal: Negative for vomiting, abdominal pain, diarrhea and blood in stool.  Genitourinary: Negative for flank pain.  Musculoskeletal: Negative for back pain  and neck pain.  Skin: Negative for rash.  Neurological: Negative for numbness and headaches.  Hematological: Does not bruise/bleed easily.  Psychiatric/Behavioral: Negative for confusion.      Allergies  Review of patient's allergies indicates no known allergies.  Home Medications   Current Outpatient Rx  Name  Route  Sig  Dispense  Refill  . Blood Glucose Monitoring Suppl (BLOOD GLUCOSE METER) kit      Use as instructed   1 each   0     One touch ultra meter to check blood sugar bid. Pa ...   . glucose blood test strip      Use as instructed   100 each   12     Dx code 250.00 to check blood sugar bid, 250.00 fl ...   . Lancets (ONETOUCH ULTRASOFT) lancets      To check blood sugars bid, 250.00 patient has fluctuating blood glucose levels.   100 each   12   . loperamide (IMODIUM A-D) 2 MG tablet   Oral   Take 1 tablet (2 mg total) by mouth 4 (four) times daily as needed for diarrhea or loose stools.   30 tablet   0   . meloxicam (MOBIC) 7.5 MG tablet   Oral   Take 1 tablet (7.5 mg total) by mouth 2 (two) times daily as needed for pain.   60 tablet   2   . ondansetron (ZOFRAN ODT) 8 MG disintegrating tablet   Oral   Take 1 tablet (8 mg total)  by mouth every 8 (eight) hours as needed for nausea.   10 tablet   0   . ranitidine (ZANTAC) 150 MG tablet   Oral   Take 1 tablet (150 mg total) by mouth 2 (two) times daily as needed for heartburn.   60 tablet   3    BP 128/73  Temp(Src) 98.1 F (36.7 C) (Oral)  Resp 18  SpO2 99% Physical Exam  Nursing note and vitals reviewed. Constitutional: He is oriented to person, place, and time. He appears well-developed and well-nourished. No distress.  HENT:  Head: Atraumatic.  Mouth/Throat: Oropharynx is clear and moist.  Eyes: Conjunctivae are normal. Pupils are equal, round, and reactive to light.  Neck: Normal range of motion. Neck supple. No tracheal deviation present. No thyromegaly present.   Cardiovascular: Normal rate, regular rhythm, normal heart sounds and intact distal pulses.  Exam reveals no gallop and no friction rub.   No murmur heard. Pulmonary/Chest: Effort normal and breath sounds normal. No accessory muscle usage. No respiratory distress. He exhibits no tenderness.  Abdominal: Soft. Bowel sounds are normal. He exhibits no distension and no mass. There is no tenderness. There is no rebound and no guarding.  Genitourinary:  No cva tenderness  Musculoskeletal: Normal range of motion.  CTLS spine, non tender, aligned, no step off. Good rom bil ext, no pain or focal bony tenderness.   Neurological: He is alert and oriented to person, place, and time.  Motor intact, 5/5 str.   Skin: Skin is warm and dry. He is not diaphoretic.  Psychiatric: He has a normal mood and affect.    ED Course  Procedures (including critical care time)  Results for orders placed during the hospital encounter of 05/10/13  URINALYSIS, ROUTINE W REFLEX MICROSCOPIC      Result Value Ref Range   Color, Urine YELLOW  YELLOW   APPearance CLEAR  CLEAR   Specific Gravity, Urine 1.020  1.005 - 1.030   pH 6.5  5.0 - 8.0   Glucose, UA 250 (*) NEGATIVE mg/dL   Hgb urine dipstick NEGATIVE  NEGATIVE   Bilirubin Urine NEGATIVE  NEGATIVE   Ketones, ur 15 (*) NEGATIVE mg/dL   Protein, ur 30 (*) NEGATIVE mg/dL   Urobilinogen, UA 1.0  0.0 - 1.0 mg/dL   Nitrite NEGATIVE  NEGATIVE   Leukocytes, UA NEGATIVE  NEGATIVE  CBC      Result Value Ref Range   WBC 5.5  4.0 - 10.5 K/uL   RBC 5.01  4.22 - 5.81 MIL/uL   Hemoglobin 15.4  13.0 - 17.0 g/dL   HCT 44.4  39.0 - 52.0 %   MCV 88.6  78.0 - 100.0 fL   MCH 30.7  26.0 - 34.0 pg   MCHC 34.7  30.0 - 36.0 g/dL   RDW 13.3  11.5 - 15.5 %   Platelets 158  150 - 400 K/uL  COMPREHENSIVE METABOLIC PANEL      Result Value Ref Range   Sodium 138  137 - 147 mEq/L   Potassium 4.3  3.7 - 5.3 mEq/L   Chloride 100  96 - 112 mEq/L   CO2 26  19 - 32 mEq/L   Glucose,  Bld 204 (*) 70 - 99 mg/dL   BUN 12  6 - 23 mg/dL   Creatinine, Ser 0.91  0.50 - 1.35 mg/dL   Calcium 8.9  8.4 - 10.5 mg/dL   Total Protein 7.5  6.0 - 8.3 g/dL   Albumin 3.7  3.5 - 5.2 g/dL   AST 21  0 - 37 U/L   ALT 18  0 - 53 U/L   Alkaline Phosphatase 75  39 - 117 U/L   Total Bilirubin 0.6  0.3 - 1.2 mg/dL   GFR calc non Af Amer 81 (*) >90 mL/min   GFR calc Af Amer >90  >90 mL/min  TROPONIN I      Result Value Ref Range   Troponin I <0.30  <0.30 ng/mL  URINE MICROSCOPIC-ADD ON      Result Value Ref Range   Squamous Epithelial / LPF RARE  RARE   WBC, UA 0-2  <3 WBC/hpf   Bacteria, UA RARE  RARE       EKG Interpretation    Date/Time:  Friday May 10 2013 11:27:45 EST Ventricular Rate:  62 PR Interval:  188 QRS Duration: 97 QT Interval:  429 QTC Calculation: 436 R Axis:   86 Text Interpretation:  Sinus rhythm No significant change since last tracing Confirmed by Dhanya Bogle  MD, Chrisandra Wiemers ([id number]) on 05/10/2013 11:42:35 AM            MDM  Iv ns. Monitor. Labs.  Ecg.   Reviewed nursing notes and prior charts for additional history.   Recheck remains in nsr, hr 70, bp normal.   On recheck of pt, pt is ambulatory about room. Normal gait/balance. Romberg normal.   Pt continues to deny any pain, no headache. No nv. No dizziness.   Spine nt.   Given ems report transient bradycardia, hr 40's, when pt symptomatic at home, discussed w pt/spouse plan for admission and monitoring.   Pt requests d/c to home, states does not want to be admitted.   Reiterated recommendation admission w pt, discussed earlier bradycardia again, including discussion of diff dx, incl SSS, vagal episode, other dysrhythmia. Also discussed risks recurrent dysrhythmia, slow heart beat, fainting, injury, cardiac arrest.   Pt again declines admission, requests d/c to home.  I discussed if unwilling to stay, plan for cardiology referral/follow up, possible outpatient/holter monitor - pt indicates he is  willing to follow up with card as outpatient.      Mirna Mires, MD 05/10/13 [phone]

## 2013-05-10 NOTE — ED Notes (Signed)
Patient is alert and orientedx4.  Patient was explained discharge instructions and they understood them with no questions.  The patient's name wife, Zabdiel Dripps is taking the patient home.

## 2013-05-10 NOTE — ED Notes (Signed)
According to EMS, patient fell outside on his deck.  Denies hitting his head, taking no medicaitons, denies LOC, N/V or diarrhea.  The patient was able to get up on his own, and walk in the house.  Patient was alert and oriented when EMS got there.  There initial were normal however, sitting in the chair his BP dropped 99/60, HR dropped to 40 and he cool and diaphoretic. Once he was placed in the truck he was fine BP came back up, HR normal.  EMS placed an IV, no medications were given and he was brought to [facility] to be evaluated.

## 2013-05-10 NOTE — ED Notes (Signed)
Family at bedside. 

## 2013-05-10 NOTE — ED Notes (Signed)
The patient did say he was treated with radiation 20 years ago for throat cancer.  He also said his salivary glands were irradiated and he makes no saliva.

## 2013-07-31 ENCOUNTER — Encounter: Payer: Self-pay | Admitting: Family Medicine

## 2013-07-31 ENCOUNTER — Ambulatory Visit: Payer: Medicare Other

## 2013-07-31 ENCOUNTER — Ambulatory Visit (INDEPENDENT_AMBULATORY_CARE_PROVIDER_SITE_OTHER): Payer: Medicare Other | Admitting: Family Medicine

## 2013-07-31 VITALS — BP 124/80 | HR 72 | Temp 98.0°F | Resp 18 | Ht 71.0 in | Wt 209.0 lb

## 2013-07-31 DIAGNOSIS — R059 Cough, unspecified: Secondary | ICD-10-CM

## 2013-07-31 DIAGNOSIS — R05 Cough: Secondary | ICD-10-CM

## 2013-07-31 DIAGNOSIS — R0601 Orthopnea: Secondary | ICD-10-CM

## 2013-07-31 DIAGNOSIS — J209 Acute bronchitis, unspecified: Secondary | ICD-10-CM | POA: Diagnosis not present

## 2013-07-31 DIAGNOSIS — R0602 Shortness of breath: Secondary | ICD-10-CM | POA: Diagnosis not present

## 2013-07-31 DIAGNOSIS — E119 Type 2 diabetes mellitus without complications: Secondary | ICD-10-CM

## 2013-07-31 LAB — POCT CBC
Granulocyte percent: 79.4 %G (ref 37–80)
HCT, POC: 50.4 % (ref 43.5–53.7)
HEMOGLOBIN: 16.6 g/dL (ref 14.1–18.1)
LYMPH, POC: 1.8 (ref 0.6–3.4)
MCH: 30.9 pg (ref 27–31.2)
MCHC: 32.9 g/dL (ref 31.8–35.4)
MCV: 93.7 fL (ref 80–97)
MID (cbc): 0.7 (ref 0–0.9)
MPV: 9.8 fL (ref 0–99.8)
POC Granulocyte: 9.8 — AB (ref 2–6.9)
POC LYMPH PERCENT: 14.7 %L (ref 10–50)
POC MID %: 5.9 %M (ref 0–12)
Platelet Count, POC: 243 10*3/uL (ref 142–424)
RBC: 5.38 M/uL (ref 4.69–6.13)
RDW, POC: 14.6 %
WBC: 12.3 10*3/uL — AB (ref 4.6–10.2)

## 2013-07-31 LAB — POCT GLYCOSYLATED HEMOGLOBIN (HGB A1C): HEMOGLOBIN A1C: 6.5

## 2013-07-31 MED ORDER — DOXYCYCLINE HYCLATE 100 MG PO CAPS: 100.0000 mg | ORAL_CAPSULE | Freq: Two times a day (BID) | ORAL | Status: DC

## 2013-07-31 NOTE — Progress Notes (Signed)
[facility] [address] [place] [address] [phone]  Date:  07/31/2013   Name:  Dustin Wagner   DOB:  02/14/1936   MRN:  [id number]  PCP:  Delman Cheadle, MD    Chief Complaint: chest congestion   History of Present Illness:  Dustin Wagner is a 78 y.o. very pleasant male patient who presents with the following:  He is here today with "chest congestion" for about 2 weeks.  He has some cough. The cough can be productive at times.   He has not felt SOB, he has not noted a fever.  He has had a few body aches and some chills.   He has not noted a runny nose.   He has not noted orthopnea, but does have some wheezing at night.   He has not noted any palpations, no CP except sometimes it will hurt in his chest wall when he coughs  He has diabetes on his problem list- he seemed not to be aware of this but then did remember getting a call regarding this about a year ago.  At that time his A1c was 6.9% and he was asked to adjust his diet.  He does not check his glucose at home.    Patient Active Problem List   Diagnosis Date Noted  . Lumbago 07/26/2012  . GERD (gastroesophageal reflux disease) 07/26/2012  . Type II or unspecified type diabetes mellitus without mention of complication, not stated as uncontrolled 07/26/2012    Past Medical History  Diagnosis Date  . Prostate cancer 04/04/2007    s/p radical prostatectomy  . 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 No pertinent past surgical history.  History  Substance Use Topics  . Smoking status: Never Smoker   . Smokeless tobacco: Not on file  . Alcohol Use: No    History reviewed. No pertinent family history.  No Known Allergies  Medication list has been reviewed and updated.  Current Outpatient Prescriptions on File Prior to Visit  Medication Sig Dispense Refill  . Blood Glucose Monitoring Suppl (BLOOD GLUCOSE METER) kit Use as instructed  1 each  0   . glucose blood test strip Use as instructed  100 each  12  . Lancets (ONETOUCH ULTRASOFT) lancets To check blood sugars bid, 250.00 patient has fluctuating blood glucose levels.  100 each  12  . loperamide (IMODIUM A-D) 2 MG tablet Take 1 tablet (2 mg total) by mouth 4 (four) times daily as needed for diarrhea or loose stools.  30 tablet  0  . meloxicam (MOBIC) 7.5 MG tablet Take 1 tablet (7.5 mg total) by mouth 2 (two) times daily as needed for pain.  60 tablet  2  . naproxen sodium (ANAPROX) 220 MG tablet Take 440 mg by mouth daily as needed (pain).      . ondansetron (ZOFRAN ODT) 8 MG disintegrating tablet Take 1 tablet (8 mg total) by mouth every 8 (eight) hours as needed for nausea.  10 tablet  0   No current facility-administered medications on file prior to visit.    Review of Systems:  As per HPI- otherwise negative.   Physical Examination: Filed Vitals:   07/31/13 1142  BP: 124/80  Pulse: 72  Temp: 98 F (36.7 C)  Resp: 18   Filed Vitals:   07/31/13 1142  Height: $Remove'5\' 11"'uRIteQp$  (1.803 m)  Weight: 209 lb (94.802 kg)   Body mass index is  29.16 kg/(m^2). Ideal Body Weight: Weight in (lb) to have BMI = 25: 178.9  GEN: WDWN, NAD, Non-toxic, A & O x 3, overweight, looks well HEENT: Atraumatic, Normocephalic. Neck supple. No masses, No LAD.  Bilateral TM wnl, oropharynx normal.  PEERL,EOMI.   Ears and Nose: No external deformity. CV: RRR, No M/G/R. No JVD. No thrill. No extra heart sounds. PULM: CTA B, no wheezes, rhonchi. No retractions. No resp. distress. No accessory muscle use.  Mild crackles right lower lung.   Able to reproduce tenderness by pressing on his chest wall ABD: S, NT, ND, +BS. No rebound. No HSM. EXTR: No c/c/e NEURO Normal gait.  PSYCH: Normally interactive. Conversant. Not depressed or anxious appearing.  Calm demeanor.   [facility] reading (PRIMARY) by  Dr. Lorelei Pont. CXR:  Negative  CHEST 2 VIEW  COMPARISON: April 02, 2007  FINDINGS: There is a degree of  underlying emphysematous change. There is a small granuloma in the left upper lobe, stable. There is stable bilateral apical pleural thickening. There is no edema or consolidation. The heart size is normal. Pulmonary vascularity reflects underlying emphysematous change. No adenopathy. There is degenerative change in the thoracic spine.  IMPRESSION: There is a degree of underlying emphysematous change. Small granuloma left upper lobe. No edema or consolidation.   Results for orders placed in visit on 07/31/13  POCT CBC      Result Value Ref Range   WBC 12.3 (*) 4.6 - 10.2 K/uL   Lymph, poc 1.8  0.6 - 3.4   POC LYMPH PERCENT 14.7  10 - 50 %L   MID (cbc) 0.7  0 - 0.9   POC MID % 5.9  0 - 12 %M   POC Granulocyte 9.8 (*) 2 - 6.9   Granulocyte percent 79.4  37 - 80 %G   RBC 5.38  4.69 - 6.13 M/uL   Hemoglobin 16.6  14.1 - 18.1 g/dL   HCT, POC 50.4  43.5 - 53.7 %   MCV 93.7  80 - 97 fL   MCH, POC 30.9  27 - 31.2 pg   MCHC 32.9  31.8 - 35.4 g/dL   RDW, POC 14.6     Platelet Count, POC 243  142 - 424 K/uL   MPV 9.8  0 - 99.8 fL  POCT GLYCOSYLATED HEMOGLOBIN (HGB A1C)      Result Value Ref Range   Hemoglobin A1C 6.5     Assessment and Plan: Cough - Plan: POCT CBC, DG Chest 2 View  Shortness of breath - Plan: Brain natriuretic peptide  Type II or unspecified type diabetes mellitus without mention of complication, not stated as uncontrolled - Plan: POCT glycosylated hemoglobin (Hb A1C)  Acute bronchitis - Plan: doxycycline (VIBRAMYCIN) 100 MG capsule  Treat for bronchitis with doxycycline.  Also check BNP due to crackles auscultated on exam Received A1c; let him know that he is right at the cut off for diagnosis of DM.  Encouraged moderate weight loss and more dietary emphasis on lean protein, vegetables as opposed to simple carbs  He will let me know if not better in the next few days- Sooner if worse.   Will plan further follow- up pending labs.   Signed Lamar Blinks,  MD

## 2013-08-01 ENCOUNTER — Encounter: Payer: Self-pay | Admitting: Family Medicine

## 2013-08-01 LAB — BRAIN NATRIURETIC PEPTIDE: Brain Natriuretic Peptide: 28.4 pg/mL (ref 0.0–100.0)

## 2013-10-02 DIAGNOSIS — N529 Male erectile dysfunction, unspecified: Secondary | ICD-10-CM | POA: Diagnosis not present

## 2013-10-02 DIAGNOSIS — N393 Stress incontinence (female) (male): Secondary | ICD-10-CM | POA: Diagnosis not present

## 2013-10-02 DIAGNOSIS — C61 Malignant neoplasm of prostate: Secondary | ICD-10-CM | POA: Diagnosis not present

## 2015-03-23 ENCOUNTER — Ambulatory Visit (INDEPENDENT_AMBULATORY_CARE_PROVIDER_SITE_OTHER): Payer: Medicare Other | Admitting: Emergency Medicine

## 2015-03-23 ENCOUNTER — Ambulatory Visit (INDEPENDENT_AMBULATORY_CARE_PROVIDER_SITE_OTHER): Payer: Medicare Other

## 2015-03-23 VITALS — BP 120/72 | HR 82 | Temp 98.3°F | Resp 12 | Ht 70.75 in | Wt 204.8 lb

## 2015-03-23 DIAGNOSIS — J189 Pneumonia, unspecified organism: Secondary | ICD-10-CM

## 2015-03-23 DIAGNOSIS — J209 Acute bronchitis, unspecified: Secondary | ICD-10-CM

## 2015-03-23 MED ORDER — LEVOFLOXACIN 500 MG PO TABS: 500.0000 mg | ORAL_TABLET | Freq: Every day | ORAL | Status: AC

## 2015-03-23 MED ORDER — HYDROCOD POLST-CPM POLST ER 10-8 MG/5ML PO SUER: 5.0000 mL | Freq: Two times a day (BID) | ORAL | Status: DC

## 2015-03-23 NOTE — Progress Notes (Signed)
Subjective:  Patient ID: Dustin Wagner, male    DOB: February 25, 1936  Age: 80 y.o. MRN: [id number]  CC: Cough; Nasal Congestion; and Generalized Body Aches   HPI POWELL HALBERT presents   Patient has at two-month history of cough. Has recently worse. And has become productive of purulent sputum. Has no wheezing or shortness breath.. He has nasal congestion or postnasal drainage. He's had a fever of 103. No chills. He is somewhat fatigued. He said no nausea vomiting or stool change no rash.  History Stanford has a past medical history of Prostate cancer ([place]) (04/04/2007) and Cancer of vallecula ([place]) (03/27/2006).   He has no past surgical history on file.   His  family history is not on file.  He   reports that he has never smoked. He does not have any smokeless tobacco history on file. He reports that he does not drink alcohol or use illicit drugs.  Outpatient Prescriptions Prior to Visit  Medication Sig Dispense Refill  . naproxen sodium (ANAPROX) 220 MG tablet Take 440 mg by mouth daily as needed (pain).    . Blood Glucose Monitoring Suppl (BLOOD GLUCOSE METER) kit Use as instructed (Patient not taking: Reported on 03/23/2015) 1 each 0  . doxycycline (VIBRAMYCIN) 100 MG capsule Take 1 capsule (100 mg total) by mouth 2 (two) times daily. (Patient not taking: Reported on 03/23/2015) 20 capsule 0  . glucose blood test strip Use as instructed (Patient not taking: Reported on 03/23/2015) 100 each 12  . Lancets (ONETOUCH ULTRASOFT) lancets To check blood sugars bid, 250.00 patient has fluctuating blood glucose levels. (Patient not taking: Reported on 03/23/2015) 100 each 12  . loperamide (IMODIUM A-D) 2 MG tablet Take 1 tablet (2 mg total) by mouth 4 (four) times daily as needed for diarrhea or loose stools. (Patient not taking: Reported on 03/23/2015) 30 tablet 0  . meloxicam (MOBIC) 7.5 MG tablet Take 1 tablet (7.5 mg total) by mouth 2 (two) times daily as needed for pain. (Patient  not taking: Reported on 03/23/2015) 60 tablet 2   No facility-administered medications prior to visit.    Social History   Social History  . Marital Status: Significant Other    Spouse Name: N/A  . Number of Children: N/A  . Years of Education: N/A   Social History Main Topics  . Smoking status: Never Smoker   . Smokeless tobacco: None  . Alcohol Use: No  . Drug Use: No  . Sexual Activity: Yes    Birth Control/ Protection: None   Other Topics Concern  . None   Social History Narrative     Review of Systems  Constitutional: Positive for fever. Negative for chills and appetite change.  HENT: Positive for congestion and postnasal drip. Negative for ear pain, sinus pressure and sore throat.   Eyes: Negative for pain and redness.  Respiratory: Positive for cough (gray sputum). Negative for shortness of breath and wheezing.   Cardiovascular: Negative for leg swelling.  Gastrointestinal: Negative for nausea, vomiting, abdominal pain, diarrhea, constipation and blood in stool.  Endocrine: Negative for polyuria.  Genitourinary: Negative for dysuria, urgency, frequency and flank pain.  Musculoskeletal: Negative for gait problem.  Skin: Negative for rash.  Neurological: Negative for weakness and headaches.  Psychiatric/Behavioral: Negative for confusion and decreased concentration. The patient is not nervous/anxious.     Objective:  BP 120/72 mmHg  Pulse 82  Temp(Src) 98.3 F (36.8 C) (Oral)  Resp 12  Ht 5' 10.75" (1.797  m)  Wt 204 lb 12.8 oz (92.897 kg)  BMI 28.77 kg/m2  SpO2 97%  Physical Exam  Constitutional: He is oriented to person, place, and time. He appears well-developed and well-nourished. No distress.  HENT:  Head: Normocephalic and atraumatic.  Right Ear: External ear normal.  Left Ear: External ear normal.  Nose: Nose normal.  Eyes: Conjunctivae and EOM are normal. Pupils are equal, round, and reactive to light. No scleral icterus.  Neck: Normal range of  motion. Neck supple. No tracheal deviation present.  Cardiovascular: Normal rate, regular rhythm and normal heart sounds.   Pulmonary/Chest: Effort normal. No respiratory distress. He has no wheezes. He has no rales.  Abdominal: He exhibits no mass. There is no tenderness. There is no rebound and no guarding.  Musculoskeletal: He exhibits no edema.  Lymphadenopathy:    He has no cervical adenopathy.  Neurological: He is alert and oriented to person, place, and time. Coordination normal.  Skin: Skin is warm and dry. No rash noted.  Psychiatric: He has a normal mood and affect. His behavior is normal.      Assessment & Plan:   Evie was seen today for cough, nasal congestion and generalized body aches.  Diagnoses and all orders for this visit:  CAP (community acquired pneumonia) -     DG Chest 2 View; Future  Other orders -     levofloxacin (LEVAQUIN) 500 MG tablet; Take 1 tablet (500 mg total) by mouth daily. -     chlorpheniramine-HYDROcodone (TUSSIONEX PENNKINETIC ER) 10-8 MG/5ML SUER; Take 5 mLs by mouth 2 (two) times daily.   I am having Mr. Bruntz start on levofloxacin and chlorpheniramine-HYDROcodone. I am also having him maintain his blood glucose meter kit and supplies, glucose blood, onetouch ultrasoft, loperamide, meloxicam, naproxen sodium, and doxycycline.  Meds ordered this encounter  Medications  . levofloxacin (LEVAQUIN) 500 MG tablet    Sig: Take 1 tablet (500 mg total) by mouth daily.    Dispense:  10 tablet    Refill:  0  . chlorpheniramine-HYDROcodone (TUSSIONEX PENNKINETIC ER) 10-8 MG/5ML SUER    Sig: Take 5 mLs by mouth 2 (two) times daily.    Dispense:  60 mL    Refill:  0    Appropriate red flag conditions were discussed with the patient as well as actions that should be taken.  Patient expressed his understanding.  Follow-up: Return in about 1 month (around 04/23/2015).  Roselee Culver, MD   [facility] reading (PRIMARY) by  Dr. Ouida Sills. RLL  cap.

## 2015-03-23 NOTE — Patient Instructions (Signed)

## 2015-04-02 DIAGNOSIS — R05 Cough: Secondary | ICD-10-CM | POA: Diagnosis not present

## 2015-10-07 ENCOUNTER — Ambulatory Visit (INDEPENDENT_AMBULATORY_CARE_PROVIDER_SITE_OTHER): Payer: Medicare Other

## 2015-10-07 ENCOUNTER — Ambulatory Visit (INDEPENDENT_AMBULATORY_CARE_PROVIDER_SITE_OTHER): Payer: Medicare Other | Admitting: Physician Assistant

## 2015-10-07 VITALS — BP 132/88 | HR 86 | Temp 98.1°F | Resp 18 | Ht 70.75 in | Wt 204.0 lb

## 2015-10-07 DIAGNOSIS — R05 Cough: Secondary | ICD-10-CM

## 2015-10-07 DIAGNOSIS — R6884 Jaw pain: Secondary | ICD-10-CM | POA: Diagnosis not present

## 2015-10-07 DIAGNOSIS — R059 Cough, unspecified: Secondary | ICD-10-CM

## 2015-10-07 DIAGNOSIS — R7309 Other abnormal glucose: Secondary | ICD-10-CM | POA: Diagnosis not present

## 2015-10-07 LAB — HEMOGLOBIN A1C
Hgb A1c MFr Bld: 7.2 % — ABNORMAL HIGH (ref <5.7)
Mean Plasma Glucose: 160 mg/dL

## 2015-10-07 LAB — POCT CBC
Granulocyte percent: 73.4 %G (ref 37–80)
HCT, POC: 44.9 % (ref 43.5–53.7)
HEMOGLOBIN: 16.1 g/dL (ref 14.1–18.1)
LYMPH, POC: 1.8 (ref 0.6–3.4)
MCH, POC: 31.8 pg — AB (ref 27–31.2)
MCHC: 35.8 g/dL — AB (ref 31.8–35.4)
MCV: 88.5 fL (ref 80–97)
MID (cbc): 0.7 (ref 0–0.9)
MPV: 7.8 fL (ref 0–99.8)
POC Granulocyte: 6.9 (ref 2–6.9)
POC LYMPH PERCENT: 19.4 %L (ref 10–50)
POC MID %: 7.2 % (ref 0–12)
Platelet Count, POC: 179 10*3/uL (ref 142–424)
RBC: 5.07 M/uL (ref 4.69–6.13)
RDW, POC: 13.9 %
WBC: 9.4 10*3/uL (ref 4.6–10.2)

## 2015-10-07 MED ORDER — DOXYCYCLINE HYCLATE 100 MG PO CAPS: 100.0000 mg | ORAL_CAPSULE | Freq: Two times a day (BID) | ORAL | Status: AC

## 2015-10-07 NOTE — Progress Notes (Signed)
10/07/2015 2:42 PM   DOB: Aug 20, 1935 / MRN: [id number]  SUBJECTIVE:  Dustin Wagner is a 80 y.o. male presenting for cough that started after a cold that started 4 weeks ago.  Reports the cough is productive of grey sputum. Associates some mild SOB, however he denies DOE, leg swelling, chest pain, orthopnea and presyncope.  He denies fevers and chills. He denies GERD and belly pain. He was seen for a similar presentation in the past and was prescribed doxy.    He has some lower left sided jaw pain that started about 3 weeks ago.  Reports that he has pain with chewing and otherwise the pain does not bother him.  He is worried because he has a history of throat cancer, however that did not come with gum pain.  He has tried Advil with good relief of the pain.  He denies difficulty swallowing, neck pain and neck swelling.  Denies facial swelling.  He was at the dentist last week and forgot to mention this problem to them.   He has No Known Allergies.   He  has a past medical history of Prostate cancer (Dustin Wagner) (04/04/2007) and Cancer of vallecula (Dustin Wagner) (03/27/2006).    He  reports that he has never smoked. He does not have any smokeless tobacco history on file. He reports that he does not drink alcohol or use illicit drugs. He  reports that he currently engages in sexual activity. He reports using the following method of birth control/protection: None. The patient  has no past surgical history on file.  His family history is not on file.  Review of Systems  Constitutional: Negative for fever.  Respiratory: Negative for cough.   Cardiovascular: Negative for chest pain, palpitations, orthopnea and leg swelling.  Skin: Negative for rash.  Neurological: Negative for dizziness and headaches.  Psychiatric/Behavioral: Negative for depression.    Problem list and medications reviewed and updated by myself where necessary, and exist elsewhere in the encounter.   OBJECTIVE:  BP 132/88 mmHg   Pulse 86  Temp(Src) 98.1 F (36.7 C) (Oral)  Resp 18  Ht 5' 10.75" (1.797 m)  Wt 204 lb (92.534 kg)  BMI 28.66 kg/m2  SpO2 97%  Physical Exam  Constitutional: He is oriented to person, place, and time. He appears well-developed. He does not appear ill.  HENT:  Head:    Right Ear: Tympanic membrane normal.  Left Ear: Tympanic membrane normal.  Mouth/Throat:    Eyes: Conjunctivae and EOM are normal. Pupils are equal, round, and reactive to light.  Cardiovascular: Normal rate.   Pulmonary/Chest: Effort normal.  Abdominal: He exhibits no distension.  Musculoskeletal: Normal range of motion.  Neurological: He is alert and oriented to person, place, and time. No cranial nerve deficit. Coordination normal.  Skin: Skin is warm and dry. He is not diaphoretic.  Psychiatric: He has a normal mood and affect.  Nursing note and vitals reviewed.   Results for orders placed or performed in visit on 10/07/15 (from the past 72 hour(s))  POCT CBC     Status: Abnormal   Collection Time: 10/07/15  2:39 PM  Result Value Ref Range   WBC 9.4 4.6 - 10.2 K/uL   Lymph, poc 1.8 0.6 - 3.4   POC LYMPH PERCENT 19.4 10 - 50 %L   MID (cbc) 0.7 0 - 0.9   POC MID % 7.2 0 - 12 %M   POC Granulocyte 6.9 2 - 6.9   Granulocyte percent 73.4 37 -  80 %G   RBC 5.07 4.69 - 6.13 M/uL   Hemoglobin 16.1 14.1 - 18.1 g/dL   HCT, POC 44.9 43.5 - 53.7 %   MCV 88.5 80 - 97 fL   MCH, POC 31.8 (A) 27 - 31.2 pg   MCHC 35.8 (A) 31.8 - 35.4 g/dL   RDW, POC 13.9 %   Platelet Count, POC 179 142 - 424 K/uL   MPV 7.8 0 - 99.8 fL    Dg Chest 2 View  10/07/2015  CLINICAL DATA:  Three-week history of cough. EXAM: CHEST  2 VIEW COMPARISON:  03/23/2015.  07/31/2013. FINDINGS: Hyperexpansion is consistent with emphysema. The lungs are clear wiithout focal pneumonia, edema, pneumothorax or pleural effusion. Interstitial markings are diffusely coarsened with chronic features. 7 mm nodular density left mid lung is stable for 2  years, suggesting benign etiology. Asymmetric nodular right apical pleural-parenchymal opacity is also unchanged. The cardiopericardial silhouette is within normal limits for size. Bones are diffusely demineralized. IMPRESSION: Stable exam. Emphysema without new or acute cardiopulmonary findings. Electronically Signed   By: Misty Stanley M.D.   On: 10/07/2015 14:27   Lab Results  Component Value Date   ALT 18 05/10/2013   AST 21 05/10/2013   ALKPHOS 75 05/10/2013   BILITOT 0.6 05/10/2013   Lab Results  Component Value Date   HGBA1C 6.5 07/31/2013     ASSESSMENT AND PLAN  Dustin Wagner was seen today for cough and neck pain.  Diagnoses and all orders for this visit:  Cough: Given that his cough is productive and this has been going on for roughly 3 weeks will treat for CAP despite negative chest rads.  Rads do show some emphysema however this is not new.  Will hold of for steroids now given the third problem.   -     POCT CBC -     DG Chest 2 View; Future  Jaw pain, non-TMJ Comments: Advised Tylenol 1000 mg q8.   Elevated hemoglobin A1c Comments: Will check an A1c should he not improve with abx so a low dose of prednisone can be considered.  Orders: -     Hemoglobin A1c    The patient was advised to call or return to clinic if he does not see an improvement in symptoms, or to seek the care of the closest emergency department if he worsens with the above plan.   Philis Fendt, MHS, PA-C [facility] 10/07/2015 2:42 PM

## 2015-10-07 NOTE — Patient Instructions (Addendum)
Please see a dentist regarding you gum tenderness.  It is okay to take 1000 mg of tylenol every 8 hours for pain.     IF you received an x-ray today, you will receive an invoice from [facility]. Please contact [facility] at [phone] with questions or concerns regarding your invoice.   IF you received labwork today, you will receive an invoice from [employer]. Please contact [facility] at [phone] with questions or concerns regarding your invoice.   Our billing staff will not be able to assist you with questions regarding bills from these companies.  You will be contacted with the lab results as soon as they are available. The fastest way to get your results is to activate your My Chart account. Instructions are located on the last page of this paperwork. If you have not heard from [place] regarding the results in 2 weeks, please contact this office.

## 2015-10-13 ENCOUNTER — Encounter: Payer: Self-pay | Admitting: Emergency Medicine

## 2016-03-15 ENCOUNTER — Ambulatory Visit (INDEPENDENT_AMBULATORY_CARE_PROVIDER_SITE_OTHER): Payer: Medicare Other | Admitting: Family Medicine

## 2016-03-15 VITALS — BP 138/78 | HR 102 | Temp 97.1°F | Resp 17 | Ht 70.75 in | Wt 201.0 lb

## 2016-03-15 DIAGNOSIS — J329 Chronic sinusitis, unspecified: Secondary | ICD-10-CM | POA: Diagnosis not present

## 2016-03-15 MED ORDER — GUAIFENESIN-CODEINE 100-10 MG/5ML PO SYRP: 5.0000 mL | ORAL_SOLUTION | Freq: Three times a day (TID) | ORAL | 0 refills | Status: AC | PRN

## 2016-03-15 MED ORDER — AMOXICILLIN-POT CLAVULANATE 875-125 MG PO TABS: 1.0000 | ORAL_TABLET | Freq: Two times a day (BID) | ORAL | 0 refills | Status: DC

## 2016-03-15 NOTE — Progress Notes (Signed)
Patient ID: YUCHEN ROVINSKY, male    DOB: 1935-12-09, 80 y.o.   MRN: [id number]  PCP: Delman Cheadle, MD  Chief Complaint  Patient presents with  . Cough  . Generalized Body Aches    Subjective:  HPI 80 year old male presents with cough and body aches times 1 week. Chronic problems include Type II diabetes and GERD. Generalized body aches and occasional productive cough. Reports an occasional wheeze and shortness of breath with coughing.   Denies fever, nausea, or vomiting. He has taken tylenol without relief of symptoms.   Social History   Social History  . Marital status: Significant Other    Spouse name: N/A  . Number of children: N/A  . Years of education: N/A   Occupational History  . Not on file.   Social History Main Topics  . Smoking status: Never Smoker  . Smokeless tobacco: Never Used  . Alcohol use No  . Drug use: No  . Sexual activity: Yes    Birth control/ protection: None   Other Topics Concern  . Not on file   Social History Narrative  . No narrative on file    History reviewed. No pertinent family history. Review of Systems See HPI  Patient Active Problem List   Diagnosis Date Noted  . Lumbago 07/26/2012  . GERD (gastroesophageal reflux disease) 07/26/2012  . Type II or unspecified type diabetes mellitus without mention of complication, not stated as uncontrolled 07/26/2012    No Known Allergies  Prior to Admission medications   Not on File    Past Medical, Surgical Family and Social History reviewed and updated.    Objective:   Today's Vitals   03/15/16 1126  BP: 138/78  Pulse: (!) 102  Resp: 17  Temp: 97.1 F (36.2 C)  TempSrc: Oral  SpO2: 96%  Weight: 201 lb (91.2 kg)  Height: 5' 10.75" (1.797 m)    Wt Readings from Last 3 Encounters:  03/15/16 201 lb (91.2 kg)  10/07/15 204 lb (92.5 kg)  03/23/15 204 lb 12.8 oz (92.9 kg)    Physical Exam  Constitutional: He is oriented to person, place, and time.  HENT:    Right Ear: Hearing and tympanic membrane normal.  Left Ear: Hearing, tympanic membrane, external ear and ear canal normal.  Nose: Mucosal edema and rhinorrhea present.  Mouth/Throat: Uvula is midline and mucous membranes are normal. Posterior oropharyngeal erythema present. No oropharyngeal exudate, posterior oropharyngeal edema or tonsillar abscesses.  Cardiovascular: Normal rate, regular rhythm, normal heart sounds and intact distal pulses.   Pulmonary/Chest: Effort normal and breath sounds normal.  Neurological: He is alert and oriented to person, place, and time.  Skin: Skin is warm and dry.  Psychiatric: He has a normal mood and affect. His behavior is normal. Judgment and thought content normal.   Today's Vitals   03/15/16 1126  BP: 138/78  Pulse: (!) 102  Resp: 17  Temp: 97.1 F (36.2 C)  TempSrc: Oral  SpO2: 96%  Weight: 201 lb (91.2 kg)  Height: 5' 10.75" (1.797 m)     1. Sinusitis, unspecified chronicity, unspecified location  . amoxicillin-clavulanate (AUGMENTIN) 875-125 MG tablet    Sig: Take 1 tablet by mouth 2 (two) times daily.    Dispense:  20 tablet  . guaiFENesin-codeine (ROBITUSSIN AC) 100-10 MG/5ML syrup    Sig: Take 5 mLs by mouth 3 (three) times daily as needed for cough.   Return for follow-up if symptoms worsen or do not improve.  Carroll Sage. Kenton Kingfisher, MSN, FNP-C [facility]

## 2016-03-15 NOTE — Patient Instructions (Addendum)
Guaifenesin w/ codeine 5 ml three times daily for cough as needed.  Start Augmentin 1 tablet twice daily with food to avoid stomach upset. Complete all medication.    IF you received an x-ray today, you will receive an invoice from [facility]. Please contact [facility] at [phone] with questions or concerns regarding your invoice.   IF you received labwork today, you will receive an invoice from [facility]. Please contact [facility] at [phone] with questions or concerns regarding your invoice.   Our billing staff will not be able to assist you with questions regarding bills from these companies.  You will be contacted with the lab results as soon as they are available. The fastest way to get your results is to activate your My Chart account. Instructions are located on the last page of this paperwork. If you have not heard from [place] regarding the results in 2 weeks, please contact this office.     Sinusitis, Adult Sinusitis is soreness and inflammation of your sinuses. Sinuses are hollow spaces in the bones around your face. They are located:  Around your eyes.  In the middle of your forehead.  Behind your nose.  In your cheekbones. Your sinuses and nasal passages are lined with a stringy fluid (mucus). Mucus normally drains out of your sinuses. When your nasal tissues get inflamed or swollen, the mucus can get trapped or blocked so air cannot flow through your sinuses. This lets bacteria, viruses, and funguses grow, and that leads to infection. Follow these instructions at home: Medicines  Take, use, or apply over-the-counter and prescription medicines only as told by your doctor. These may include nasal sprays.  If you were prescribed an antibiotic medicine, take it as told by your doctor. Do not stop taking the antibiotic even if you start to feel better. Hydrate and Humidify  Drink enough water to keep your pee (urine) clear or pale yellow.  Use a cool  mist humidifier to keep the humidity level in your home above 50%.  Breathe in steam for 10-15 minutes, 3-4 times a day or as told by your doctor. You can do this in the bathroom while a hot shower is running.  Try not to spend time in cool or dry air. Rest  Rest as much as possible.  Sleep with your head raised (elevated).  Make sure to get enough sleep each night. General instructions  Put a warm, moist washcloth on your face 3-4 times a day or as told by your doctor. This will help with discomfort.  Wash your hands often with soap and water. If there is no soap and water, use hand sanitizer.  Do not smoke. Avoid being around people who are smoking (secondhand smoke).  Keep all follow-up visits as told by your doctor. This is important. Contact a doctor if:  You have a fever.  Your symptoms get worse.  Your symptoms do not get better within 10 days. Get help right away if:  You have a very bad headache.  You cannot stop throwing up (vomiting).  You have pain or swelling around your face or eyes.  You have trouble seeing.  You feel confused.  Your neck is stiff.  You have trouble breathing. This information is not intended to replace advice given to you by your health care provider. Make sure you discuss any questions you have with your health care provider. Document Released: 08/24/2007 Document Revised: 11/01/2015 Document Reviewed: 12/31/2014 Elsevier Interactive Patient Education  2017 [employer].

## 2016-03-16 ENCOUNTER — Emergency Department (HOSPITAL_COMMUNITY): Payer: Medicare Other

## 2016-03-16 ENCOUNTER — Inpatient Hospital Stay (HOSPITAL_COMMUNITY)
Admission: EM | Admit: 2016-03-16 | Discharge: 2016-03-18 | DRG: 193 | Disposition: A | Discharge status: Home / Self Care | Payer: Medicare Other | Attending: Internal Medicine | Admitting: Internal Medicine

## 2016-03-16 ENCOUNTER — Encounter (HOSPITAL_COMMUNITY): Payer: Self-pay

## 2016-03-16 DIAGNOSIS — Z809 Family history of malignant neoplasm, unspecified: Secondary | ICD-10-CM | POA: Diagnosis not present

## 2016-03-16 DIAGNOSIS — E860 Dehydration: Secondary | ICD-10-CM | POA: Diagnosis present

## 2016-03-16 DIAGNOSIS — Z8546 Personal history of malignant neoplasm of prostate: Secondary | ICD-10-CM

## 2016-03-16 DIAGNOSIS — J9601 Acute respiratory failure with hypoxia: Secondary | ICD-10-CM | POA: Diagnosis present

## 2016-03-16 DIAGNOSIS — Z881 Allergy status to other antibiotic agents status: Secondary | ICD-10-CM

## 2016-03-16 DIAGNOSIS — Z79899 Other long term (current) drug therapy: Secondary | ICD-10-CM

## 2016-03-16 DIAGNOSIS — Z888 Allergy status to other drugs, medicaments and biological substances status: Secondary | ICD-10-CM

## 2016-03-16 DIAGNOSIS — Z923 Personal history of irradiation: Secondary | ICD-10-CM

## 2016-03-16 DIAGNOSIS — Z85818 Personal history of malignant neoplasm of other sites of lip, oral cavity, and pharynx: Secondary | ICD-10-CM

## 2016-03-16 DIAGNOSIS — R739 Hyperglycemia, unspecified: Secondary | ICD-10-CM | POA: Diagnosis not present

## 2016-03-16 DIAGNOSIS — R7302 Impaired glucose tolerance (oral): Secondary | ICD-10-CM | POA: Diagnosis not present

## 2016-03-16 DIAGNOSIS — E871 Hypo-osmolality and hyponatremia: Secondary | ICD-10-CM | POA: Diagnosis present

## 2016-03-16 DIAGNOSIS — E1165 Type 2 diabetes mellitus with hyperglycemia: Secondary | ICD-10-CM | POA: Diagnosis present

## 2016-03-16 DIAGNOSIS — J189 Pneumonia, unspecified organism: Principal | ICD-10-CM | POA: Diagnosis present

## 2016-03-16 DIAGNOSIS — Z9079 Acquired absence of other genital organ(s): Secondary | ICD-10-CM | POA: Diagnosis not present

## 2016-03-16 DIAGNOSIS — R531 Weakness: Secondary | ICD-10-CM | POA: Diagnosis not present

## 2016-03-16 LAB — CBC WITH DIFFERENTIAL/PLATELET
BASOS ABS: 0 10*3/uL (ref 0.0–0.1)
Basophils Relative: 0 %
Eosinophils Absolute: 0 10*3/uL (ref 0.0–0.7)
Eosinophils Relative: 0 %
HCT: 44 % (ref 39.0–52.0)
HEMOGLOBIN: 15 g/dL (ref 13.0–17.0)
LYMPHS ABS: 0.4 10*3/uL — AB (ref 0.7–4.0)
LYMPHS PCT: 5 %
MCH: 30.5 pg (ref 26.0–34.0)
MCHC: 34.1 g/dL (ref 30.0–36.0)
MCV: 89.4 fL (ref 78.0–100.0)
Monocytes Absolute: 1 10*3/uL (ref 0.1–1.0)
Monocytes Relative: 11 %
NEUTROS PCT: 84 %
Neutro Abs: 8 10*3/uL — ABNORMAL HIGH (ref 1.7–7.7)
Platelets: 160 10*3/uL (ref 150–400)
RBC: 4.92 MIL/uL (ref 4.22–5.81)
RDW: 13.8 % (ref 11.5–15.5)
WBC: 9.4 10*3/uL (ref 4.0–10.5)

## 2016-03-16 LAB — I-STAT TROPONIN, ED: Troponin i, poc: 0.01 ng/mL (ref 0.00–0.08)

## 2016-03-16 LAB — INFLUENZA PANEL BY PCR (TYPE A & B)
INFLAPCR: NEGATIVE
Influenza B By PCR: NEGATIVE

## 2016-03-16 LAB — COMPREHENSIVE METABOLIC PANEL
ALT: 19 U/L (ref 17–63)
AST: 22 U/L (ref 15–41)
Albumin: 3.8 g/dL (ref 3.5–5.0)
Alkaline Phosphatase: 54 U/L (ref 38–126)
Anion gap: 11 (ref 5–15)
BUN: 21 mg/dL — ABNORMAL HIGH (ref 6–20)
CHLORIDE: 97 mmol/L — AB (ref 101–111)
CO2: 23 mmol/L (ref 22–32)
CREATININE: 1.09 mg/dL (ref 0.61–1.24)
Calcium: 8.5 mg/dL — ABNORMAL LOW (ref 8.9–10.3)
Glucose, Bld: 213 mg/dL — ABNORMAL HIGH (ref 65–99)
POTASSIUM: 4.3 mmol/L (ref 3.5–5.1)
Sodium: 131 mmol/L — ABNORMAL LOW (ref 135–145)
TOTAL PROTEIN: 7.6 g/dL (ref 6.5–8.1)
Total Bilirubin: 1.8 mg/dL — ABNORMAL HIGH (ref 0.3–1.2)

## 2016-03-16 LAB — BRAIN NATRIURETIC PEPTIDE: B NATRIURETIC PEPTIDE 5: 21.3 pg/mL (ref 0.0–100.0)

## 2016-03-16 LAB — GLUCOSE, CAPILLARY: Glucose-Capillary: 188 mg/dL — ABNORMAL HIGH (ref 65–99)

## 2016-03-16 MED ORDER — INSULIN ASPART 100 UNIT/ML ~~LOC~~ SOLN: 0.0000 [IU] | Freq: Three times a day (TID) | SUBCUTANEOUS | Status: DC

## 2016-03-16 MED ORDER — ACETAMINOPHEN 650 MG RE SUPP: 650.0000 mg | Freq: Four times a day (QID) | RECTAL | Status: DC | PRN

## 2016-03-16 MED ORDER — DEXTROSE 5 % IV SOLN
1.0000 g | Freq: Once | INTRAVENOUS | Status: AC
  Administered 2016-03-16: 1 g via INTRAVENOUS
  Filled 2016-03-16: qty 10

## 2016-03-16 MED ORDER — ENOXAPARIN SODIUM 40 MG/0.4ML ~~LOC~~ SOLN: 40.0000 mg | SUBCUTANEOUS | Status: DC

## 2016-03-16 MED ORDER — IPRATROPIUM-ALBUTEROL 0.5-2.5 (3) MG/3ML IN SOLN
3.0000 mL | Freq: Once | RESPIRATORY_TRACT | Status: AC
  Administered 2016-03-16: 3 mL via RESPIRATORY_TRACT
  Filled 2016-03-16: qty 3

## 2016-03-16 MED ORDER — DEXTROSE 5 % IV SOLN
500.0000 mg | Freq: Once | INTRAVENOUS | Status: AC
  Administered 2016-03-16: 500 mg via INTRAVENOUS
  Filled 2016-03-16: qty 500

## 2016-03-16 MED ORDER — SODIUM CHLORIDE 0.9 % IV SOLN
INTRAVENOUS | Status: DC
  Administered 2016-03-16: 75 mL/h via INTRAVENOUS
  Administered 2016-03-17 (×2): via INTRAVENOUS

## 2016-03-16 MED ORDER — ACETAMINOPHEN 325 MG PO TABS
650.0000 mg | ORAL_TABLET | Freq: Four times a day (QID) | ORAL | Status: DC | PRN
  Administered 2016-03-16: 650 mg via ORAL
  Filled 2016-03-16: qty 2

## 2016-03-16 MED ORDER — AZITHROMYCIN 250 MG PO TABS
500.0000 mg | ORAL_TABLET | ORAL | Status: DC
  Administered 2016-03-17 – 2016-03-18 (×2): 500 mg via ORAL
  Filled 2016-03-16 (×2): qty 2

## 2016-03-16 MED ORDER — SODIUM CHLORIDE 0.9 % IV BOLUS (SEPSIS)
500.0000 mL | Freq: Once | INTRAVENOUS | Status: AC
  Administered 2016-03-16: 500 mL via INTRAVENOUS

## 2016-03-16 MED ORDER — DEXTROSE 5 % IV SOLN
1.0000 g | INTRAVENOUS | Status: DC
  Administered 2016-03-17 – 2016-03-18 (×2): 1 g via INTRAVENOUS
  Filled 2016-03-16 (×2): qty 10

## 2016-03-16 NOTE — H&P (Signed)
History and Physical  Dustin Wagner [id number] DOB: 05-29-1935 DOA: 03/16/2016  PCP: Delman Cheadle, MD  Patient coming from: home  Chief Complaint: cough  HPI:  80 year old man PMH throat cancer treated 20 years ago with radiation, presented to the emergency department with approximate 1 week history of cough, congestion, generalized weakness, poor appetite. Initial evaluation revealed hypoxia by report and clinical history suggested pneumonia.  Patient reports 1 week history of illness with fatigue, generalized weakness, very poor appetite, has not eaten for several days. Productive cough. No definite shortness of breath but has been wheezing. Nonsmoker. No definite aggravating or alleviating factors. Associated vomiting. Denies sinus pressure.  Chart reviewed summary:. Seen at urgent care 12/26, diagnosis sinusitis, started on Augmentin.  ED Course: Afebrile, vital signs stable. EDP reported hypoxia on room air which I do not see documented. Treated with bronchodilators, ceftriaxone, azithromycin, 500 mL normal saline  Pertinent labs: Sodium 131, chloride 97, remainder CMP unremarkable. BNP within normal limits. Troponin negative. CBC unremarkable.  EKG: Independently reviewed. Normal sinus rhythm. No acute changes.  Imaging:Chest x-ray and apparently reviewed. No acute disease.   Review of Systems:  Negative for fever, new visual changes, sore throat, rash, new muscle aches, chest pain, SOB, dysuria, bleeding, abdominal pain.  Past Medical History:  Diagnosis Date  . 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 Prostate cancer ([place]) 04/04/2007   s/p radical prostatectomy    Past Surgical History:  Procedure Laterality Date  . PROSTATE SURGERY       reports that he has never smoked. He has never used smokeless tobacco. He reports that he does not drink alcohol or use drugs. Ambulatory   No Known Allergies  Family History  Problem Relation Age of Onset    . Cancer Sister      Prior to Admission medications   Medication Sig Start Date End Date Taking? Authorizing Provider  amoxicillin-clavulanate (AUGMENTIN) 875-125 MG tablet Take 1 tablet by mouth 2 (two) times daily. 03/15/16  Yes Sedalia Muta, FNP  guaiFENesin-codeine [facility]) 100-10 MG/5ML syrup Take 5 mLs by mouth 3 (three) times daily as needed for cough. 03/15/16  Yes Sedalia Muta, FNP    Physical Exam: Vitals:   03/16/16 0515 03/16/16 0600 03/16/16 0615 03/16/16 0754  BP: 116/72 127/67 122/66   Pulse: 80 81 77   Resp: 19 18 17    Temp:      TempSrc:      SpO2: (!) 88% 90% 93% 90%    Constitutional:  . Appears calm. Mildly uncomfortable. Nontoxic. Appears ill. Eyes:  . Pupils and irises appear normal . Normal lids ENMT:  . external ears, nose appear normal . grossly normal hearing Neck:  . neck appears normal, no masses . no thyromegaly Respiratory:  . Bilateral bronchospasm, wheezes, poor air movement. No rhonchi or rales. . Mild increased respiratory effort. Able to speak in full sentences. Cardiovascular:  . RRR, no m/r/g . No LE extremity edema   Abdomen:  . Abdomen appears normal; no tenderness or masses . No hernias noted Musculoskeletal:  . RUE, LUE, RLE, LLE   o strength and tone normal, no atrophy, no abnormal movements o No tenderness, masses Skin:  . No rashes, lesions, ulcers noted . palpation of skin: no induration or nodules Psychiatric:  . judgement and insight appear normal . Mental status o Mood, affect appropriate  Wt Readings from Last 3 Encounters:  03/15/16 91.2 kg (201 lb)  10/07/15 92.5 kg (204  lb)  03/23/15 92.9 kg (204 lb 12.8 oz)    I have personally reviewed following labs and imaging studies  Labs on Admission:  CBC:  Recent Labs Lab 03/16/16 0636  WBC 9.4  NEUTROABS 8.0*  HGB 15.0  HCT 44.0  MCV 89.4  PLT 0000000   Basic Metabolic Panel:  Recent Labs Lab 03/16/16 0636  NA  131*  K 4.3  CL 97*  CO2 23  GLUCOSE 213*  BUN 21*  CREATININE 1.09  CALCIUM 8.5*   Liver Function Tests:  Recent Labs Lab 03/16/16 0636  AST 22  ALT 19  ALKPHOS 54  BILITOT 1.8*  PROT 7.6  ALBUMIN 3.8   Radiological Exams on Admission: Dg Chest 2 View  Result Date: 03/16/2016 CLINICAL DATA:  Worsening cough and congestion over the last week. Shortness of breath. EXAM: CHEST  2 VIEW COMPARISON:  10/07/2015 FINDINGS: The lungs are hyperinflated with emphysema. Normal heart size and mediastinal contours. Small nodular density in the left mid lung is partially obscured by adjacent monitoring lead. Mild right infrahilar atelectasis. There is biapical pleuroparenchymal scarring. No pleural fluid or pneumothorax. Degenerative change in the spine. IMPRESSION: Mild right infrahilar atelectasis.  Otherwise unchanged exam. Electronically Signed   By: Jeb Levering M.D.   On: 03/16/2016 06:34     Active Problems:   Community acquired pneumonia   Assessment/Plan 1. Community-acquired pneumonia with associated acute hypoxic respiratory failure, generalized weakness, poor appetite. Hypoxia reported by EDP, I do not see any documented by nursing.  Antibiotics, supplemental oxygen as needed, bronchodilators  Wean oxygen as tolerated  Admit to medical floor 2. Dehydration.  Secondary to poor oral intake. IV fluids. 3. Hyperglycemia. Random blood sugar 213. Anion gap within normal limits.  Denies history of diabetes. Hemoglobin A1c 7.20 September 2015.  Check hemoglobin A1c. Check CBG every before meals, daily at bedtime.  DVT prophylaxis: SCDs Code Status: full code Family Communication: wife at bedside  It is my clinical opinion that admission to INPATIENT is reasonable and necessary in this patient . presenting with symptoms of cough, chest congestion, wheezing, concerning for community-acquired pneumonia  . with pertinent positives on physical exam including: Bilateral  bronchospasm, wheezing, poor air movement . and pertinent positives on radiographic and laboratory data including: Hyperglycemia  Given the aforementioned, the predictability of an adverse outcome is felt to be significant. I expect that the patient will require at least 2 midnights in the hospital to treat this condition.   Time spent: 50 minutes  Murray Hodgkins, MD  [facility] Direct contact: [phone] --[place] [place]  --[email or web address]; password [email or web address]  7PM-7AM contact night coverage as above  03/16/2016, 12:06 PM

## 2016-03-16 NOTE — ED Triage Notes (Signed)
Pt states that he feels very weak and hasn't been able to eat for three days, he vomited yesterday, no diarrhea Pt says he was dx with pneumonia at Urgent Care yesterday

## 2016-03-16 NOTE — ED Provider Notes (Signed)
[facility] DEPT Provider Note   CSN: [id number] Arrival date & time: 03/16/16  0341     History   Chief Complaint Chief Complaint  Patient presents with  . Weakness    HPI Dustin MANCHEGO is a 80 y.o. male.  Patient is a 79 year old male with history of throat cancer treated 20 years ago with radiation, but otherwise healthy. He presents for evaluation of weakness and malaise that has worsened over the past several days. He reports chest congestion and cough and that he is having difficulty eating or drinking. He denies any definite fever but has felt chilled. He denies any chest pain, but does feel somewhat short of breath.   The history is provided by the patient.  Weakness  This is a new problem. Episode onset: Several days ago. The problem has been gradually worsening. There was no focality noted. There has been no fever. Associated symptoms include shortness of breath. Pertinent negatives include no chest pain.    Past Medical History:  Diagnosis Date  . 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 Prostate cancer ([place]) 04/04/2007   s/p radical prostatectomy    Patient Active Problem List   Diagnosis Date Noted  . Lumbago 07/26/2012  . GERD (gastroesophageal reflux disease) 07/26/2012  . Type II or unspecified type diabetes mellitus without mention of complication, not stated as uncontrolled 07/26/2012    History reviewed. No pertinent surgical history.     Home Medications    Prior to Admission medications   Medication Sig Start Date End Date Taking? Authorizing Provider  amoxicillin-clavulanate (AUGMENTIN) 875-125 MG tablet Take 1 tablet by mouth 2 (two) times daily. 03/15/16  Yes Sedalia Muta, FNP  guaiFENesin-codeine [facility]) 100-10 MG/5ML syrup Take 5 mLs by mouth 3 (three) times daily as needed for cough. 03/15/16  Yes Sedalia Muta, [place]    Family History History reviewed. No pertinent family  history.  Social History Social History  Substance Use Topics  . Smoking status: Never Smoker  . Smokeless tobacco: Never Used  . Alcohol use No     Allergies   Patient has no known allergies.   Review of Systems Review of Systems  Respiratory: Positive for shortness of breath.   Cardiovascular: Negative for chest pain.  Neurological: Positive for weakness.  All other systems reviewed and are negative.    Physical Exam Updated Vital Signs BP 122/66   Pulse 77   Temp 98.3 F (36.8 C) (Oral)   Resp 17   SpO2 93%   Physical Exam  Constitutional: He is oriented to person, place, and time. He appears well-developed and well-nourished. No distress.  HENT:  Head: Normocephalic and atraumatic.  Mouth/Throat: Oropharynx is clear and moist.  Neck: Normal range of motion. Neck supple.  Cardiovascular: Normal rate and regular rhythm.  Exam reveals no friction rub.   No murmur heard. Pulmonary/Chest: Effort normal. No respiratory distress. He has no wheezes. He has rales.  There are rales in the bases bilaterally.  Abdominal: Soft. Bowel sounds are normal. He exhibits no distension. There is no tenderness.  Musculoskeletal: Normal range of motion. He exhibits no edema.  Neurological: He is alert and oriented to person, place, and time. Coordination normal.  Skin: Skin is warm and dry. He is not diaphoretic.  Nursing note and vitals reviewed.    ED Treatments / Results  Labs (all labs ordered are listed, but only abnormal results are displayed) Labs Reviewed  COMPREHENSIVE METABOLIC PANEL -  Abnormal; Notable for the following:       Result Value   Sodium 131 (*)    Chloride 97 (*)    Glucose, Bld 213 (*)    BUN 21 (*)    Calcium 8.5 (*)    Total Bilirubin 1.8 (*)    All other components within normal limits  CBC WITH DIFFERENTIAL/PLATELET - Abnormal; Notable for the following:    Neutro Abs 8.0 (*)    Lymphs Abs 0.4 (*)    All other components within normal limits   BRAIN NATRIURETIC PEPTIDE  I-STAT TROPOININ, ED    EKG  EKG Interpretation None       Radiology Dg Chest 2 View  Result Date: 03/16/2016 CLINICAL DATA:  Worsening cough and congestion over the last week. Shortness of breath. EXAM: CHEST  2 VIEW COMPARISON:  10/07/2015 FINDINGS: The lungs are hyperinflated with emphysema. Normal heart size and mediastinal contours. Small nodular density in the left mid lung is partially obscured by adjacent monitoring lead. Mild right infrahilar atelectasis. There is biapical pleuroparenchymal scarring. No pleural fluid or pneumothorax. Degenerative change in the spine. IMPRESSION: Mild right infrahilar atelectasis.  Otherwise unchanged exam. Electronically Signed   By: Jeb Levering M.D.   On: 03/16/2016 06:34    Procedures Procedures (including critical care time)  Medications Ordered in ED Medications  sodium chloride 0.9 % bolus 500 mL (500 mLs Intravenous New Bag/Given 03/16/16 0641)     Initial Impression / Assessment and Plan / ED Course  I have reviewed the triage vital signs and the nursing notes.  Pertinent labs & imaging results that were available during my care of the patient were reviewed by me and considered in my medical decision making (see chart for details).  Clinical Course     Patient presents here with complaints of congestion, cough, and difficulty breathing over the past several days. He is hypoxic in the emergency department with oxygen saturations in the upper 80s. His workup reveals no obvious infiltrate, however clinically I feel as though this patient has pneumonia. He will be treated with appropriate antibiotics and admitted to the hospitalist service under the care of Dr. Sarajane Jews for further workup of his hypoxia and treatment.  Final Clinical Impressions(s) / ED Diagnoses   Final diagnoses:  None    New Prescriptions New Prescriptions   No medications on file     Veryl Speak, MD 03/17/16 [phone]

## 2016-03-17 DIAGNOSIS — J9601 Acute respiratory failure with hypoxia: Secondary | ICD-10-CM

## 2016-03-17 DIAGNOSIS — R7302 Impaired glucose tolerance (oral): Secondary | ICD-10-CM

## 2016-03-17 LAB — CBC
HCT: 42 % (ref 39.0–52.0)
HEMOGLOBIN: 14.2 g/dL (ref 13.0–17.0)
MCH: 30.5 pg (ref 26.0–34.0)
MCHC: 33.8 g/dL (ref 30.0–36.0)
MCV: 90.1 fL (ref 78.0–100.0)
Platelets: 155 10*3/uL (ref 150–400)
RBC: 4.66 MIL/uL (ref 4.22–5.81)
RDW: 13.8 % (ref 11.5–15.5)
WBC: 5.4 10*3/uL (ref 4.0–10.5)

## 2016-03-17 LAB — STREP PNEUMONIAE URINARY ANTIGEN: STREP PNEUMO URINARY ANTIGEN: NEGATIVE

## 2016-03-17 LAB — EXPECTORATED SPUTUM ASSESSMENT W REFEX TO RESP CULTURE

## 2016-03-17 LAB — BASIC METABOLIC PANEL
ANION GAP: 8 (ref 5–15)
BUN: 21 mg/dL — ABNORMAL HIGH (ref 6–20)
CALCIUM: 8.2 mg/dL — AB (ref 8.9–10.3)
CO2: 27 mmol/L (ref 22–32)
Chloride: 100 mmol/L — ABNORMAL LOW (ref 101–111)
Creatinine, Ser: 0.87 mg/dL (ref 0.61–1.24)
GFR calc Af Amer: >60 mL/min (ref >60)
GFR calc non Af Amer: >60 mL/min (ref >60)
GLUCOSE: 124 mg/dL — AB (ref 65–99)
Potassium: 4 mmol/L (ref 3.5–5.1)
Sodium: 135 mmol/L (ref 135–145)

## 2016-03-17 LAB — GLUCOSE, CAPILLARY
GLUCOSE-CAPILLARY: 174 mg/dL — AB (ref 65–99)
Glucose-Capillary: 130 mg/dL — ABNORMAL HIGH (ref 65–99)
Glucose-Capillary: 155 mg/dL — ABNORMAL HIGH (ref 65–99)

## 2016-03-17 LAB — FERRITIN: FERRITIN: 157 ng/mL (ref 24–336)

## 2016-03-17 LAB — IRON AND TIBC
Iron: 46 ug/dL (ref 45–182)
Saturation Ratios: 16 % — ABNORMAL LOW (ref 17.9–39.5)
TIBC: 291 ug/dL (ref 250–450)
UIBC: 245 ug/dL

## 2016-03-17 MED ORDER — IPRATROPIUM-ALBUTEROL 0.5-2.5 (3) MG/3ML IN SOLN
3.0000 mL | Freq: Two times a day (BID) | RESPIRATORY_TRACT | Status: DC
  Filled 2016-03-17: qty 3

## 2016-03-17 MED ORDER — IPRATROPIUM-ALBUTEROL 0.5-2.5 (3) MG/3ML IN SOLN
3.0000 mL | Freq: Four times a day (QID) | RESPIRATORY_TRACT | Status: DC
  Filled 2016-03-17: qty 3

## 2016-03-17 MED ORDER — IPRATROPIUM-ALBUTEROL 0.5-2.5 (3) MG/3ML IN SOLN
3.0000 mL | RESPIRATORY_TRACT | Status: DC | PRN
  Administered 2016-03-18 (×2): 3 mL via RESPIRATORY_TRACT
  Filled 2016-03-17 (×2): qty 3

## 2016-03-17 NOTE — Progress Notes (Signed)
Pt refuses neb treatment for the night. And the duration of this admission. Pt states that he does not take any nebs/inhalers what so ever at home and he wishes not to take any neb treatments. Pt neb regimen will be made PRN if needed for SOB/wheezing. A neb set up bag is in his room if needed. Pt is stable at this time no respiratory distress noted.

## 2016-03-17 NOTE — Progress Notes (Signed)
PROGRESS NOTE  Dustin Wagner [id number] DOB: 01/06/1936 DOA: 03/16/2016 PCP: Delman Cheadle, MD  Brief History:  80 year old male with a history of prostate cancer and throat cancer presented with one-week history of cough, congestion, and generalized weakness. The patient also had associated wheezing for one to 2 days prior to admission. He states that his wife was sick approximately one to 2 weeks ago for a respiratory type illness for which she required antibiotics. The patient had an episode of nausea and vomiting on 03/15/2016, but has resolved since then. He denied any fevers, chills, abdominal pain, dysuria, hematuria. The patient was noted to be hypoxic in the emergency department. Chest x-ray showed hyperinflation with increasing right greater than left lower lobe opacities.  Assessment/Plan: Acute respiratory failure with hypoxia -Presently stable on 2 L -Chest x-ray shows hyperinflation suggesting possibly underlying undiagnosed COPD -Start bronchodilators  Pneumonia -Continue ceftriaxone and azithromycin -Influenza PCR negative -Check viral respiratory panel  Hyperglycemia -10/07/2015 hemoglobin A1c 7.2 -Repeat hemoglobin A1c -Patient is in a bit of denial regarding his impaired glucose tolerance  Dehydration/hyponatremia -Continue IV fluids-->Na improved    Disposition Plan:   Home in 1-2 days  Family Communication:   No Family at bedside--Total time spent 35 minutes.  Greater than 50% spent face to face counseling and coordinating care.   Consultants:  none  Code Status:  FULL  DVT Prophylaxis:  [place] Lovenox   Procedures: As Listed in Progress Note Above  Antibiotics: None    Subjective: Patient continues to complain of nonproductive cough and wheezing. Denies any nausea, vomiting, diarrhea, vomiting, dysuria, hematuria. Denies any fevers, chills, headache, neck pain. No hemoptysis.  Objective: Vitals:   03/16/16 1715 03/16/16 2125  03/17/16 0200 03/17/16 0544  BP: (!) 157/74 133/82  132/87  Pulse: 66 75 (!) 59 60  Resp:  18 16 16   Temp: 98.6 F (37 C) 98.4 F (36.9 C)    TempSrc: Oral Oral    SpO2: 97% 92% 95% 97%  Weight: 91.2 kg (201 lb)     Height: 5' 10.75" (1.797 m)       Intake/Output Summary (Last 24 hours) at 03/17/16 1004 Last data filed at 03/17/16 0600  Gross per 24 hour  Intake           1297.5 ml  Output              400 ml  Net            897.5 ml   Weight change:  Exam:   General:  Pt is alert, follows commands appropriately, not in acute distress  HEENT: No icterus, No thrush, No neck mass, [place]/AT  Cardiovascular: RRR, S1/S2, no rubs, no gallops  Respiratory:Bibasilar rhonchi with expiratory wheeze. Good air movement.  Abdomen: Soft/+BS, non tender, non distended, no guarding  Extremities: No edema, No lymphangitis, No petechiae, No rashes, no synovitis   Data Reviewed: I have personally reviewed following labs and imaging studies Basic Metabolic Panel:  Recent Labs Lab 03/16/16 0636 03/17/16 0411  NA 131* 135  K 4.3 4.0  CL 97* 100*  CO2 23 27  GLUCOSE 213* 124*  BUN 21* 21*  CREATININE 1.09 0.87  CALCIUM 8.5* 8.2*   Liver Function Tests:  Recent Labs Lab 03/16/16 0636  AST 22  ALT 19  ALKPHOS 54  BILITOT 1.8*  PROT 7.6  ALBUMIN 3.8   No results for input(s): LIPASE, AMYLASE in the last 168  hours. No results for input(s): AMMONIA in the last 168 hours. Coagulation Profile: No results for input(s): INR, PROTIME in the last 168 hours. CBC:  Recent Labs Lab 03/16/16 0636 03/17/16 0411  WBC 9.4 5.4  NEUTROABS 8.0*  --   HGB 15.0 14.2  HCT 44.0 42.0  MCV 89.4 90.1  PLT 160 155   Cardiac Enzymes: No results for input(s): CKTOTAL, CKMB, CKMBINDEX, TROPONINI in the last 168 hours. BNP: Invalid input(s): POCBNP CBG:  Recent Labs Lab 03/16/16 2123 03/17/16 0744  GLUCAP 188* 130*   HbA1C: No results for input(s): HGBA1C in the last 72  hours. Urine analysis:    Component Value Date/Time   COLORURINE YELLOW 05/10/2013 [place] 05/10/2013 1208   LABSPEC 1.020 05/10/2013 1208   PHURINE 6.5 05/10/2013 1208   GLUCOSEU 250 (A) 05/10/2013 1208   HGBUR NEGATIVE 05/10/2013 1208   BILIRUBINUR NEGATIVE 05/10/2013 1208   KETONESUR 15 (A) 05/10/2013 1208   PROTEINUR 30 (A) 05/10/2013 1208   UROBILINOGEN 1.0 05/10/2013 1208   NITRITE NEGATIVE 05/10/2013 1208   LEUKOCYTESUR NEGATIVE 05/10/2013 1208   Sepsis Labs: @LABRCNTIP (procalcitonin:4,lacticidven:4) )No results found for this or any previous visit (from the past 240 hour(s)).   Scheduled Meds: . azithromycin  500 mg Oral Q24H  . cefTRIAXone (ROCEPHIN)  IV  1 g Intravenous Q24H  . enoxaparin (LOVENOX) injection  40 mg Subcutaneous Q24H  . insulin aspart  0-9 Units Subcutaneous TID WC   Continuous Infusions: . sodium chloride 75 mL/hr at 03/17/16 0830    Procedures/Studies: Dg Chest 2 View  Result Date: 03/16/2016 CLINICAL DATA:  Worsening cough and congestion over the last week. Shortness of breath. EXAM: CHEST  2 VIEW COMPARISON:  10/07/2015 FINDINGS: The lungs are hyperinflated with emphysema. Normal heart size and mediastinal contours. Small nodular density in the left mid lung is partially obscured by adjacent monitoring lead. Mild right infrahilar atelectasis. There is biapical pleuroparenchymal scarring. No pleural fluid or pneumothorax. Degenerative change in the spine. IMPRESSION: Mild right infrahilar atelectasis.  Otherwise unchanged exam. Electronically Signed   By: Jeb Levering M.D.   On: 03/16/2016 06:34    Skyler Dusing, DO  [facility] Pager [phone]  If 7PM-7AM, please contact night-coverage [email or web address] Password [email or web address] 03/17/2016, 10:04 AM   LOS: 1 day

## 2016-03-18 LAB — RESPIRATORY PANEL BY PCR
ADENOVIRUS-RVPPCR: NOT DETECTED
Bordetella pertussis: NOT DETECTED
CORONAVIRUS 229E-RVPPCR: NOT DETECTED
CORONAVIRUS OC43-RVPPCR: NOT DETECTED
Chlamydophila pneumoniae: NOT DETECTED
Coronavirus HKU1: NOT DETECTED
Coronavirus NL63: NOT DETECTED
INFLUENZA B-RVPPCR: NOT DETECTED
Influenza A: NOT DETECTED
MYCOPLASMA PNEUMONIAE-RVPPCR: NOT DETECTED
Metapneumovirus: DETECTED — AB
PARAINFLUENZA VIRUS 1-RVPPCR: NOT DETECTED
Parainfluenza Virus 2: NOT DETECTED
Parainfluenza Virus 3: NOT DETECTED
Parainfluenza Virus 4: NOT DETECTED
Respiratory Syncytial Virus: NOT DETECTED
Rhinovirus / Enterovirus: NOT DETECTED

## 2016-03-18 LAB — HEMOGLOBIN A1C
HEMOGLOBIN A1C: 7 % — AB (ref 4.8–5.6)
Mean Plasma Glucose: 154 mg/dL

## 2016-03-18 MED ORDER — AZITHROMYCIN 500 MG PO TABS: 500.0000 mg | ORAL_TABLET | Freq: Every day | ORAL | 0 refills | Status: AC

## 2016-03-18 NOTE — Progress Notes (Signed)
Pt was discharged home today. Instructions were reviewed with patient, and questions were answered. Pt was taken to main entrance via wheelchair by NT.  

## 2016-03-18 NOTE — Discharge Summary (Signed)
Physician Discharge Summary  Dustin Wagner [id number] DOB: 06-27-1935 DOA: 03/16/2016  PCP: Delman Cheadle, MD  Admit date: 03/16/2016 Discharge date: 03/18/2016  Admitted From: Home Disposition:  Home  Recommendations for Outpatient Follow-up:  1. Follow up with PCP in 1-2 weeks 2. Please obtain BMP/CBC in one week :  Home Health:no Equipment/Devices:none  Discharge Condition: Stable CODE STATUS:FULL Diet recommendation: Heart Healthy / Carb Modified    Brief/Interim Summary: 80 year old male with a history of prostate cancer and throat cancer presented with one-week history of cough, congestion, and generalized weakness. The patient also had associated wheezing for one to 2 days prior to admission. He states that his wife was sick approximately one to 2 weeks ago for a respiratory type illness for which she required antibiotics. The patient had an episode of nausea and vomiting on 03/15/2016, but has resolved since then. He denied any fevers, chills, abdominal pain, dysuria, hematuria. The patient was noted to be hypoxic in the emergency department. Chest x-ray showed hyperinflation with increasing right greater than left lower lobe opacities.  Discharge Diagnoses:  Acute respiratory failure with hypoxia -Initially stable on 2 L-->weaned to RA at time of d/c -Chest x-ray shows hyperinflation suggesting possibly underlying undiagnosed COPD -Start bronchodilators  Pneumonia -Continue ceftriaxone and azithromycin -Influenza PCR negative -Check viral respiratory pane--POSlTIVE Metapneumovirus -d/c home with azithromycin x 3 more days to complete 5 days of therapy  Hyperglycemia--DM2 -10/07/2015 hemoglobin A1c 7.2 -03/17/16 Repeat hemoglobin A1c--7-0 -Patient is in a bit of denial regarding his impaired glucose tolerance -pt does not want to be placed on any meds presently-->follow up with PCP  Dehydration/hyponatremia -Continue IV fluids-->Na  improved   Discharge Instructions  Discharge Instructions    Diet - low sodium heart healthy    Complete by:  As directed    Increase activity slowly    Complete by:  As directed      Allergies as of 03/18/2016   No Known Allergies     Medication List    STOP taking these medications   amoxicillin-clavulanate 875-125 MG tablet Commonly known as:  AUGMENTIN     TAKE these medications   azithromycin 500 MG tablet Commonly known as:  ZITHROMAX Take 1 tablet (500 mg total) by mouth daily. Start taking on:  03/19/2016   guaiFENesin-codeine 100-10 MG/5ML syrup Commonly known as:  ROBITUSSIN AC Take 5 mLs by mouth 3 (three) times daily as needed for cough.       No Known Allergies  Consultations:  none   Procedures/Studies: Dg Chest 2 View  Result Date: 03/16/2016 CLINICAL DATA:  Worsening cough and congestion over the last week. Shortness of breath. EXAM: CHEST  2 VIEW COMPARISON:  10/07/2015 FINDINGS: The lungs are hyperinflated with emphysema. Normal heart size and mediastinal contours. Small nodular density in the left mid lung is partially obscured by adjacent monitoring lead. Mild right infrahilar atelectasis. There is biapical pleuroparenchymal scarring. No pleural fluid or pneumothorax. Degenerative change in the spine. IMPRESSION: Mild right infrahilar atelectasis.  Otherwise unchanged exam. Electronically Signed   By: Jeb Levering M.D.   On: 03/16/2016 06:34         Discharge Exam: Vitals:   03/18/16 0559 03/18/16 1453  BP: 130/78 131/66  Pulse: 61 72  Resp: 14 16  Temp: 98 F (36.7 C) 98.3 F (36.8 C)   Vitals:   03/18/16 0559 03/18/16 1016 03/18/16 1453 03/18/16 1510  BP: 130/78  131/66   Pulse: 61  72   Resp: 14  16   Temp: 98 F (36.7 C)  98.3 F (36.8 C)   TempSrc: Oral  Oral   SpO2: 93% 91% 100% 98%  Weight:      Height:        General: Pt is alert, awake, not in acute distress Cardiovascular: RRR, S1/S2 +, no rubs, no  gallops Respiratory: CTA bilaterally, no wheezing, no rhonchi Abdominal: Soft, NT, ND, bowel sounds + Extremities: no edema, no cyanosis   The results of significant diagnostics from this hospitalization (including imaging, microbiology, ancillary and laboratory) are listed below for reference.    Significant Diagnostic Studies: Dg Chest 2 View  Result Date: 03/16/2016 CLINICAL DATA:  Worsening cough and congestion over the last week. Shortness of breath. EXAM: CHEST  2 VIEW COMPARISON:  10/07/2015 FINDINGS: The lungs are hyperinflated with emphysema. Normal heart size and mediastinal contours. Small nodular density in the left mid lung is partially obscured by adjacent monitoring lead. Mild right infrahilar atelectasis. There is biapical pleuroparenchymal scarring. No pleural fluid or pneumothorax. Degenerative change in the spine. IMPRESSION: Mild right infrahilar atelectasis.  Otherwise unchanged exam. Electronically Signed   By: Jeb Levering M.D.   On: 03/16/2016 06:34     Microbiology: Recent Results (from the past 240 hour(s))  Culture, sputum-assessment     Status: None   Collection Time: 03/16/16  3:18 PM  Result Value Ref Range Status   Specimen Description EXPECTORATED SPUTUM  Final   Special Requests NONE  Final   Sputum evaluation THIS SPECIMEN IS ACCEPTABLE FOR SPUTUM CULTURE  Final   Report Status 03/17/2016 FINAL  Final  Culture, respiratory (NON-Expectorated)     Status: None (Preliminary result)   Collection Time: 03/16/16  3:18 PM  Result Value Ref Range Status   Specimen Description EXPECTORATED SPUTUM  Final   Special Requests NONE Reflexed from [id number]  Final   Gram Stain   Final    ABUNDANT WBC PRESENT, PREDOMINANTLY PMN FEW YEAST    Culture   Final    TOO YOUNG TO READ Performed at [facility]    Report Status PENDING  Incomplete  Respiratory Panel by PCR     Status: Abnormal   Collection Time: 03/17/16  3:00 PM  Result Value Ref Range Status    Adenovirus NOT DETECTED NOT DETECTED Final   Coronavirus 229E NOT DETECTED NOT DETECTED Final   Coronavirus HKU1 NOT DETECTED NOT DETECTED Final   Coronavirus NL63 NOT DETECTED NOT DETECTED Final   Coronavirus OC43 NOT DETECTED NOT DETECTED Final   Metapneumovirus DETECTED (A) NOT DETECTED Final   Rhinovirus / Enterovirus NOT DETECTED NOT DETECTED Final   Influenza A NOT DETECTED NOT DETECTED Final   Influenza B NOT DETECTED NOT DETECTED Final   Parainfluenza Virus 1 NOT DETECTED NOT DETECTED Final   Parainfluenza Virus 2 NOT DETECTED NOT DETECTED Final   Parainfluenza Virus 3 NOT DETECTED NOT DETECTED Final   Parainfluenza Virus 4 NOT DETECTED NOT DETECTED Final   Respiratory Syncytial Virus NOT DETECTED NOT DETECTED Final   Bordetella pertussis NOT DETECTED NOT DETECTED Final   Chlamydophila pneumoniae NOT DETECTED NOT DETECTED Final   Mycoplasma pneumoniae NOT DETECTED NOT DETECTED Final    Comment: Performed at [place]: Basic Metabolic Panel:  Recent Labs Lab 03/16/16 0636 03/17/16 0411  NA 131* 135  K 4.3 4.0  CL 97* 100*  CO2 23 27  GLUCOSE 213* 124*  BUN 21* 21*  CREATININE 1.09 0.87  CALCIUM 8.5* 8.2*   Liver Function Tests:  Recent Labs Lab 03/16/16 0636  AST 22  ALT 19  ALKPHOS 54  BILITOT 1.8*  PROT 7.6  ALBUMIN 3.8   No results for input(s): LIPASE, AMYLASE in the last 168 hours. No results for input(s): AMMONIA in the last 168 hours. CBC:  Recent Labs Lab 03/16/16 0636 03/17/16 0411  WBC 9.4 5.4  NEUTROABS 8.0*  --   HGB 15.0 14.2  HCT 44.0 42.0  MCV 89.4 90.1  PLT 160 155   Cardiac Enzymes: No results for input(s): CKTOTAL, CKMB, CKMBINDEX, TROPONINI in the last 168 hours. BNP: Invalid input(s): POCBNP CBG:  Recent Labs Lab 03/16/16 2123 03/17/16 0744 03/17/16 1225 03/17/16 1711  GLUCAP 188* 130* 174* 155*    Time coordinating discharge:  Greater than 30 minutes  Signed:  Anhad Sheeley, DO [facility] Pager: [phone] 03/18/2016, 4:11 PM

## 2016-03-19 LAB — CULTURE, RESPIRATORY

## 2016-03-19 LAB — CULTURE, RESPIRATORY W GRAM STAIN: Culture: NORMAL

## 2017-03-02 IMAGING — CR DG CHEST 2V
2 series · 2 of 2 positions shown · non-contrast
Comparison: 10/07/2015

CLINICAL DATA: Worsening cough and congestion over the last week.
Shortness of breath.

EXAM:
CHEST  2 VIEW

[w chest pa]
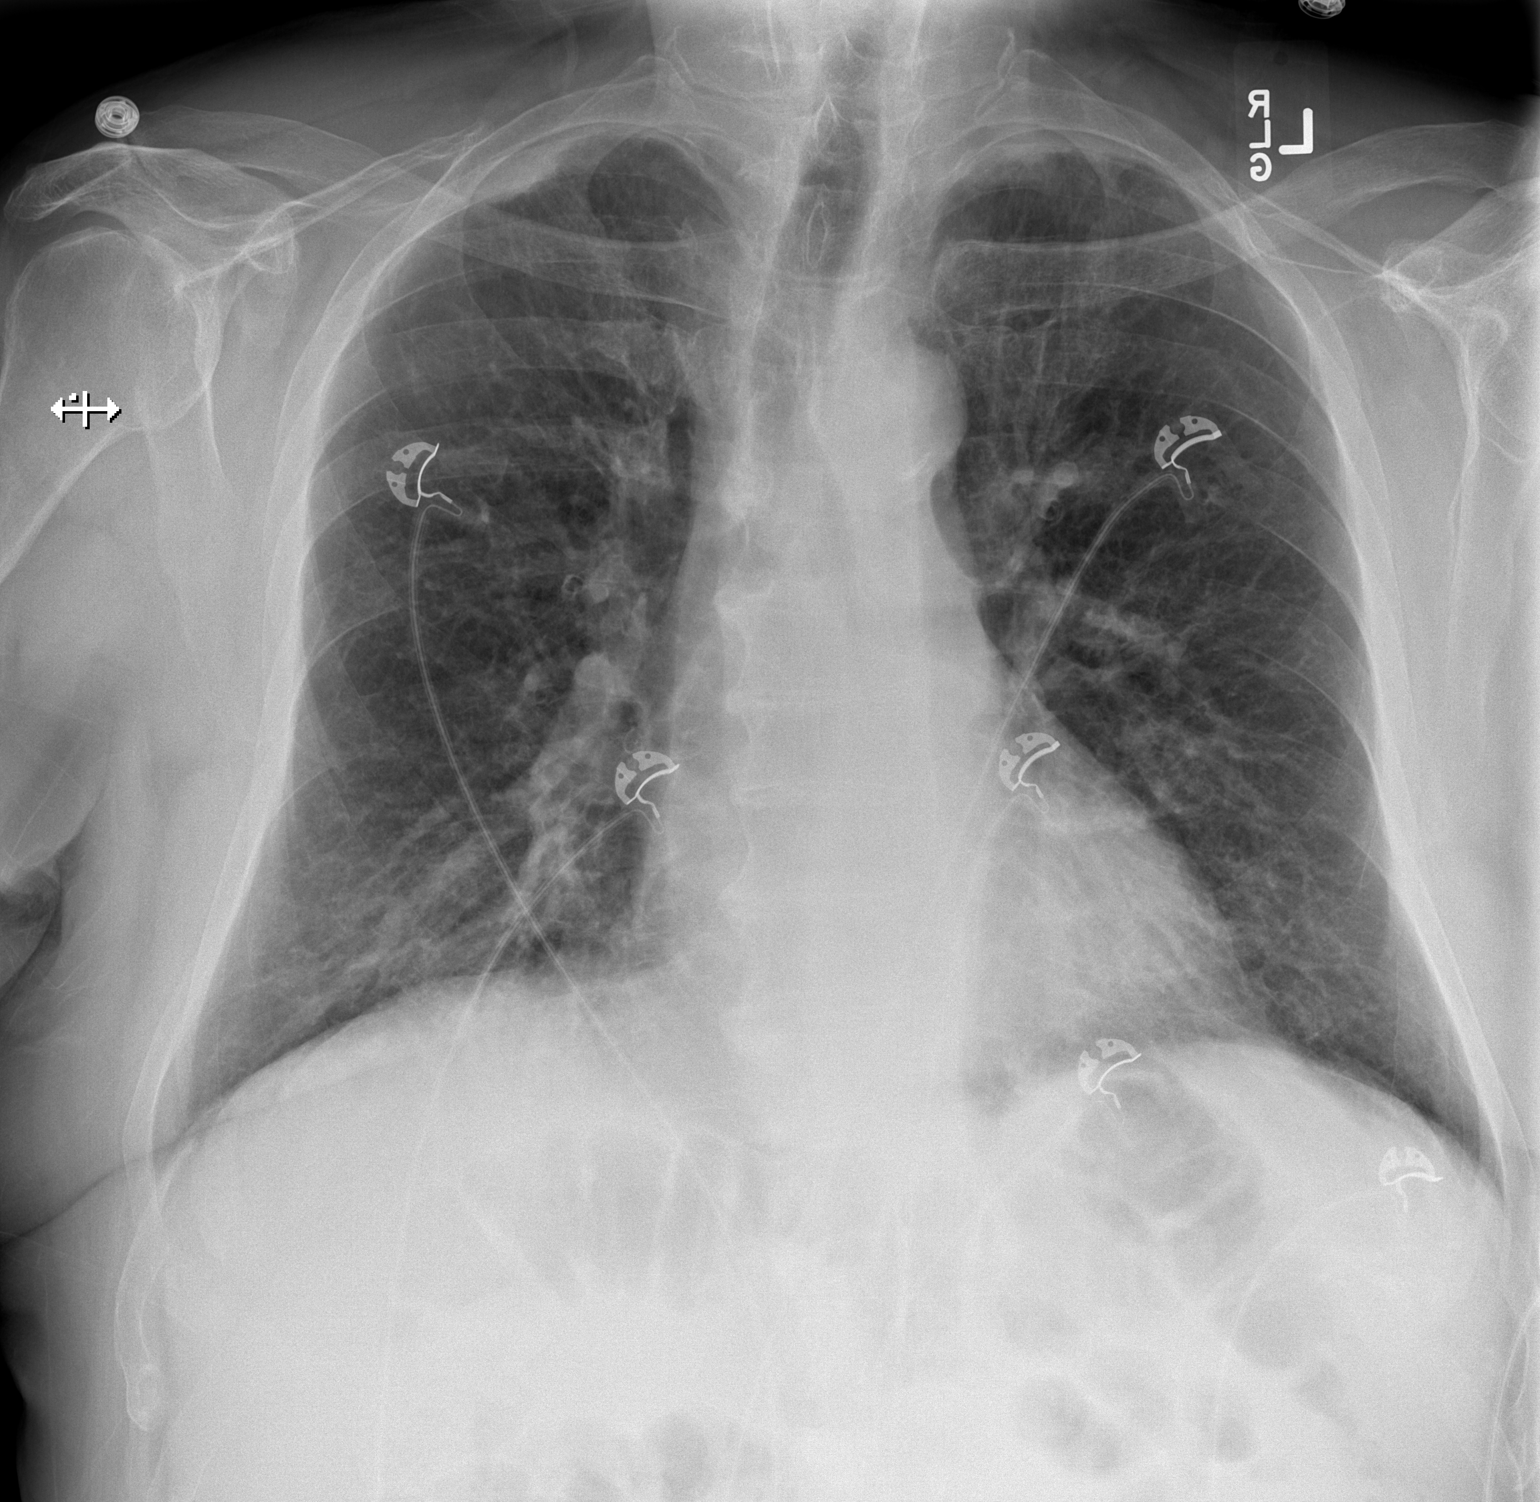

[w chest lat]
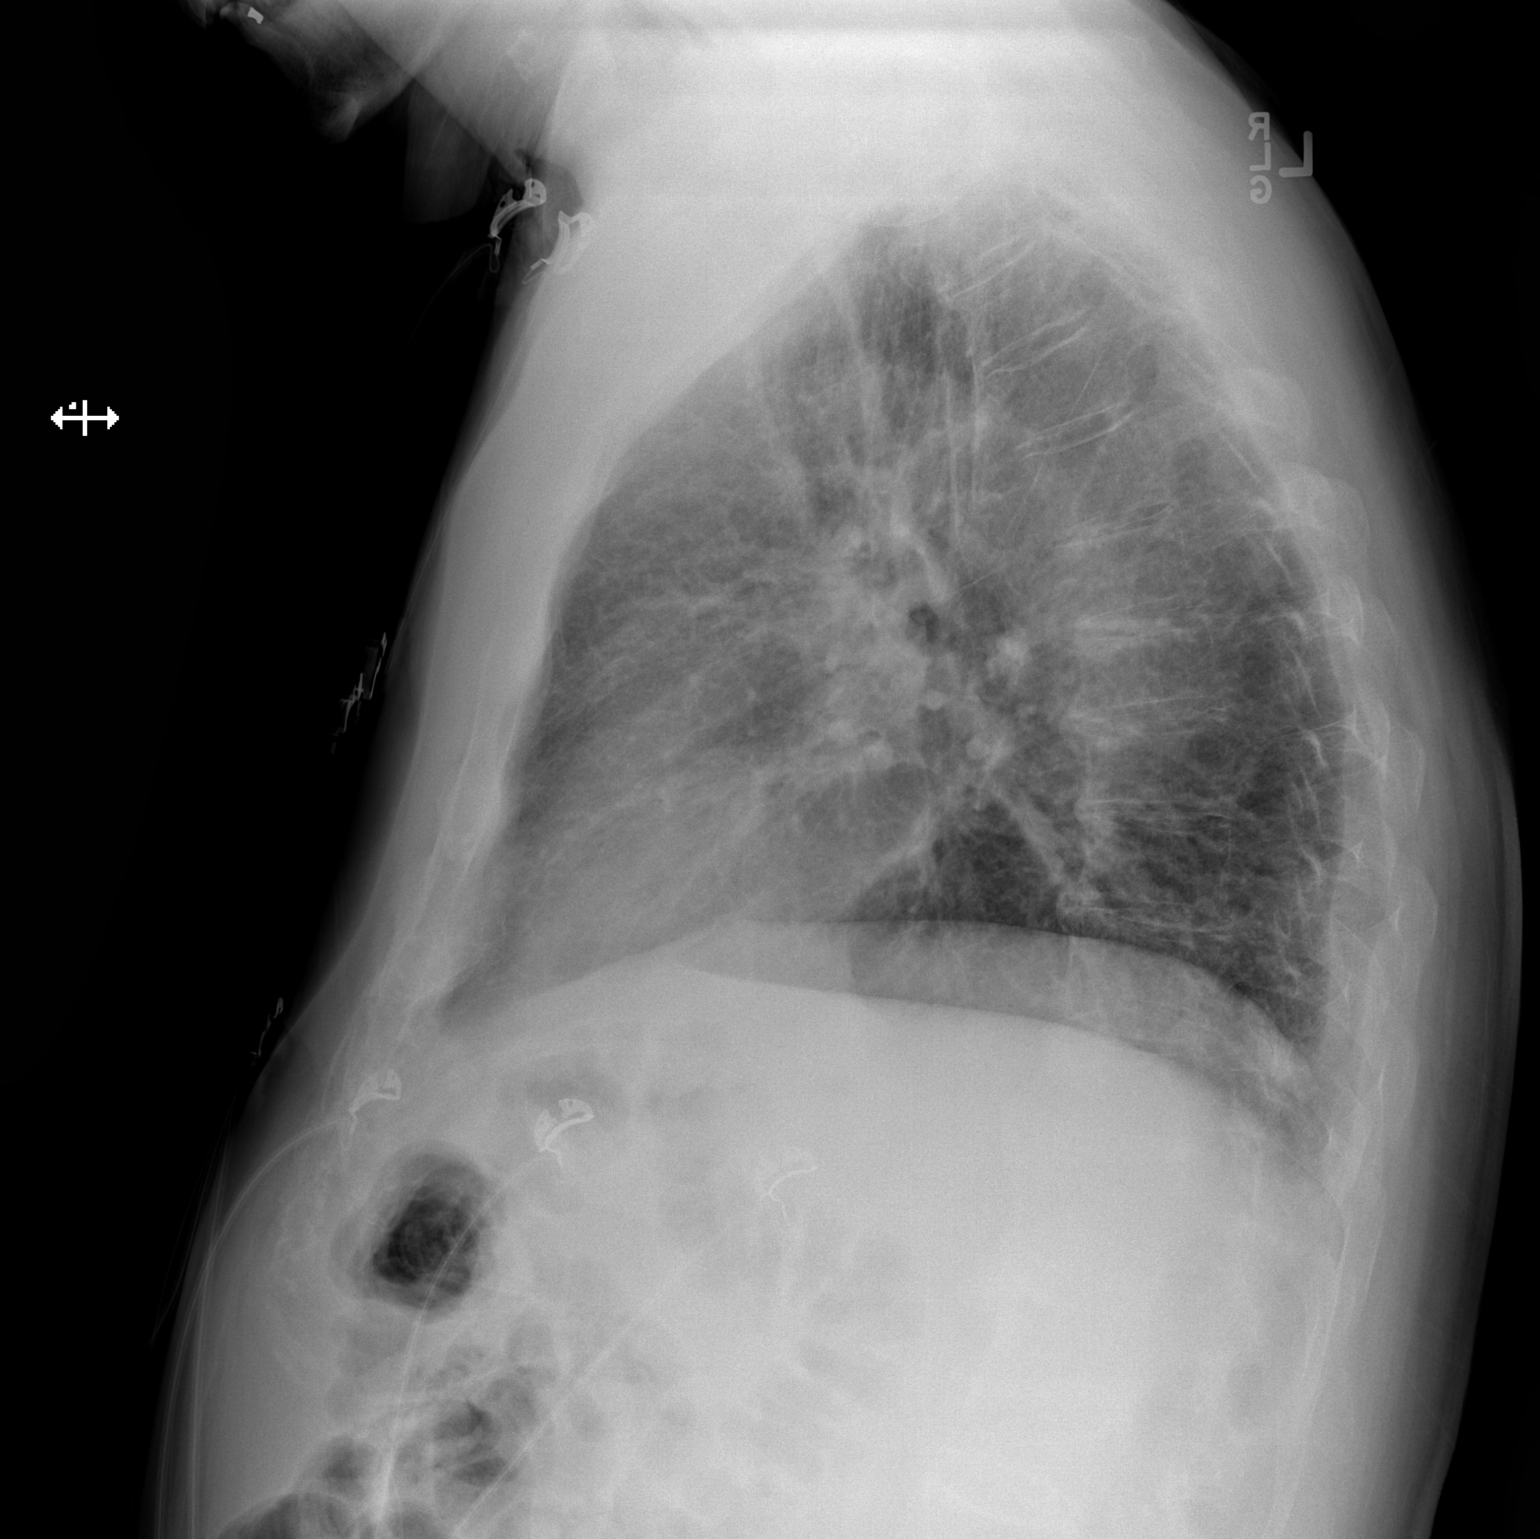

[2 of 2 positions shown; findings below may reference images not displayed]

FINDINGS: The lungs are hyperinflated with emphysema. Normal heart size and
mediastinal contours. Small nodular density in the left mid lung is
partially obscured by adjacent monitoring lead. Mild right
infrahilar atelectasis. There is biapical pleuroparenchymal
scarring. No pleural fluid or pneumothorax. Degenerative change in
the spine.
IMPRESSION: Mild right infrahilar atelectasis.  Otherwise unchanged exam.

## 2020-12-19 DEATH — deceased
